# Patient Record
Sex: Female | Born: 1955 | Race: White | Hispanic: No | Marital: Married | Smoking: Never smoker
Health system: Southern US, Community
[De-identification: ages and names within clinical notes are randomized; demographics above are authoritative.]

## PROBLEM LIST (undated history)

## (undated) DIAGNOSIS — S6730XA Crushing injury of unspecified wrist, initial encounter: Secondary | ICD-10-CM

## (undated) DIAGNOSIS — I10 Essential (primary) hypertension: Secondary | ICD-10-CM

## (undated) DIAGNOSIS — B009 Herpesviral infection, unspecified: Secondary | ICD-10-CM

## (undated) HISTORY — DX: Crushing injury of unspecified wrist, initial encounter: S67.30XA

## (undated) HISTORY — PX: REDUCTION MAMMAPLASTY: SUR839

## (undated) HISTORY — DX: Essential (primary) hypertension: I10

## (undated) HISTORY — PX: TYMPANOPLASTY: SHX33

## (undated) HISTORY — DX: Herpesviral infection, unspecified: B00.9

---

## 1981-11-30 HISTORY — PX: BREAST ENHANCEMENT SURGERY: SHX7

## 1998-10-09 ENCOUNTER — Other Ambulatory Visit: Admission: RE | Admit: 1998-10-09 | Discharge: 1998-10-09 | Payer: Self-pay | Admitting: *Deleted

## 1999-09-23 ENCOUNTER — Ambulatory Visit (HOSPITAL_COMMUNITY): Admission: RE | Admit: 1999-09-23 | Discharge: 1999-09-23 | Payer: Self-pay | Admitting: *Deleted

## 1999-09-23 ENCOUNTER — Encounter: Payer: Self-pay | Admitting: *Deleted

## 1999-10-01 ENCOUNTER — Other Ambulatory Visit: Admission: RE | Admit: 1999-10-01 | Discharge: 1999-10-01 | Payer: Self-pay | Admitting: *Deleted

## 2000-10-27 ENCOUNTER — Other Ambulatory Visit: Admission: RE | Admit: 2000-10-27 | Discharge: 2000-10-27 | Payer: Self-pay | Admitting: *Deleted

## 2001-01-25 ENCOUNTER — Ambulatory Visit (HOSPITAL_COMMUNITY): Admission: RE | Admit: 2001-01-25 | Discharge: 2001-01-25 | Payer: Self-pay | Admitting: *Deleted

## 2001-01-25 ENCOUNTER — Encounter: Payer: Self-pay | Admitting: *Deleted

## 2001-02-01 ENCOUNTER — Encounter: Admission: RE | Admit: 2001-02-01 | Discharge: 2001-02-01 | Payer: Self-pay | Admitting: *Deleted

## 2001-02-01 ENCOUNTER — Encounter: Payer: Self-pay | Admitting: *Deleted

## 2001-11-07 ENCOUNTER — Other Ambulatory Visit: Admission: RE | Admit: 2001-11-07 | Discharge: 2001-11-07 | Payer: Self-pay | Admitting: *Deleted

## 2002-09-06 ENCOUNTER — Ambulatory Visit (HOSPITAL_COMMUNITY): Admission: RE | Admit: 2002-09-06 | Discharge: 2002-09-06 | Payer: Self-pay | Admitting: *Deleted

## 2002-09-06 ENCOUNTER — Encounter: Payer: Self-pay | Admitting: *Deleted

## 2002-11-09 ENCOUNTER — Other Ambulatory Visit: Admission: RE | Admit: 2002-11-09 | Discharge: 2002-11-09 | Payer: Self-pay | Admitting: *Deleted

## 2003-01-08 ENCOUNTER — Ambulatory Visit (HOSPITAL_COMMUNITY): Admission: RE | Admit: 2003-01-08 | Discharge: 2003-01-08 | Payer: Self-pay | Admitting: Internal Medicine

## 2003-10-01 HISTORY — PX: ABLATION: SHX5711

## 2003-10-23 ENCOUNTER — Ambulatory Visit (HOSPITAL_BASED_OUTPATIENT_CLINIC_OR_DEPARTMENT_OTHER): Admission: RE | Admit: 2003-10-23 | Discharge: 2003-10-23 | Payer: Self-pay | Admitting: Obstetrics and Gynecology

## 2003-11-15 ENCOUNTER — Other Ambulatory Visit: Admission: RE | Admit: 2003-11-15 | Discharge: 2003-11-15 | Payer: Self-pay | Admitting: *Deleted

## 2003-11-28 ENCOUNTER — Encounter: Admission: RE | Admit: 2003-11-28 | Discharge: 2003-11-28 | Payer: Self-pay | Admitting: *Deleted

## 2004-04-30 HISTORY — PX: BREAST CYST ASPIRATION: SHX578

## 2004-05-05 ENCOUNTER — Encounter: Admission: RE | Admit: 2004-05-05 | Discharge: 2004-05-05 | Payer: Self-pay | Admitting: Obstetrics and Gynecology

## 2004-08-30 DIAGNOSIS — S6730XA Crushing injury of unspecified wrist, initial encounter: Secondary | ICD-10-CM

## 2004-08-30 HISTORY — DX: Crushing injury of unspecified wrist, initial encounter: S67.30XA

## 2004-08-31 ENCOUNTER — Emergency Department (HOSPITAL_COMMUNITY): Admission: EM | Admit: 2004-08-31 | Discharge: 2004-08-31 | Payer: Self-pay | Admitting: Emergency Medicine

## 2004-12-25 ENCOUNTER — Ambulatory Visit (HOSPITAL_COMMUNITY): Admission: RE | Admit: 2004-12-25 | Discharge: 2004-12-25 | Payer: Self-pay | Admitting: Internal Medicine

## 2005-01-06 ENCOUNTER — Other Ambulatory Visit: Admission: RE | Admit: 2005-01-06 | Discharge: 2005-01-06 | Payer: Self-pay | Admitting: *Deleted

## 2005-02-10 ENCOUNTER — Encounter: Admission: RE | Admit: 2005-02-10 | Discharge: 2005-02-10 | Payer: Self-pay | Admitting: *Deleted

## 2005-10-05 ENCOUNTER — Encounter: Admission: RE | Admit: 2005-10-05 | Discharge: 2005-10-05 | Payer: Self-pay | Admitting: *Deleted

## 2006-01-26 ENCOUNTER — Other Ambulatory Visit: Admission: RE | Admit: 2006-01-26 | Discharge: 2006-01-26 | Payer: Self-pay | Admitting: Obstetrics & Gynecology

## 2006-05-18 ENCOUNTER — Encounter: Admission: RE | Admit: 2006-05-18 | Discharge: 2006-05-18 | Payer: Self-pay | Admitting: Obstetrics & Gynecology

## 2006-09-30 HISTORY — PX: BREAST IMPLANT REMOVAL: SUR1101

## 2007-03-11 ENCOUNTER — Other Ambulatory Visit: Admission: RE | Admit: 2007-03-11 | Discharge: 2007-03-11 | Payer: Self-pay | Admitting: Obstetrics & Gynecology

## 2007-09-29 ENCOUNTER — Encounter: Admission: RE | Admit: 2007-09-29 | Discharge: 2007-09-29 | Payer: Self-pay | Admitting: Family Medicine

## 2008-06-06 ENCOUNTER — Other Ambulatory Visit: Admission: RE | Admit: 2008-06-06 | Discharge: 2008-06-06 | Payer: Self-pay | Admitting: Obstetrics & Gynecology

## 2008-10-03 ENCOUNTER — Encounter: Admission: RE | Admit: 2008-10-03 | Discharge: 2008-10-03 | Payer: Self-pay | Admitting: Obstetrics & Gynecology

## 2009-10-07 ENCOUNTER — Encounter: Admission: RE | Admit: 2009-10-07 | Discharge: 2009-10-07 | Payer: Self-pay | Admitting: Obstetrics & Gynecology

## 2010-10-13 ENCOUNTER — Encounter: Admission: RE | Admit: 2010-10-13 | Discharge: 2010-10-13 | Payer: Self-pay | Admitting: Obstetrics & Gynecology

## 2010-10-21 DIAGNOSIS — Z9884 Bariatric surgery status: Secondary | ICD-10-CM | POA: Insufficient documentation

## 2010-10-24 ENCOUNTER — Encounter: Admission: RE | Admit: 2010-10-24 | Discharge: 2010-10-24 | Payer: Self-pay | Admitting: Obstetrics & Gynecology

## 2010-12-21 ENCOUNTER — Encounter: Payer: Self-pay | Admitting: Obstetrics & Gynecology

## 2011-04-17 NOTE — Op Note (Signed)
NAME:  Katherine Young, Katherine Young                            ACCOUNT NO.:  [id number]   MEDICAL RECORD NO.:  [id number]                   PATIENT TYPE:  AMB   LOCATION:  DAY                                  FACILITY:  [facility]   PHYSICIAN:  R. Roetta Sessions, M.D.              DATE OF BIRTH:  Aug 16, 1956   DATE OF PROCEDURE:  01/08/2003  DATE OF DISCHARGE:                                 OPERATIVE REPORT   PROCEDURE:  Esophagogastroduodenoscopy with biopsy.   INDICATION FOR PROCEDURE:  The patient is a 55 year old lady with  longstanding chronic reflux symptoms.  She comes for EGD.  We saw this lady  originally back in June 2003; however, because of her mother's illness she  put off evaluation until now.  She has been taking Prevacid 30 mg orally  daily to twice daily to combat symptoms, and she has breakthrough symptoms a  couple of times a week.  She is at least 30 pounds over her ideal body  weight.  Please see my 05/23/02 consultation note and my 01/08/03 updated H&P  for more information.   MONITORING:  O2 saturation, blood pressure, pulse, and respirations were  monitored throughout the entirety of the procedure.   ANESTHESIA:  Conscious sedation with Versed 3 mg IV, Demerol 50 mg IV.   INSTRUMENT:  Instrument:  Olympus video chip gastroscope.   DESCRIPTION OF PROCEDURE:   FINDINGS:  Examination of the tubular esophagus revealed no mucosal  abnormalities or evidence of Barrett's esophagus, etc.  The EG junction was  easily traversed.   Stomach:  The gastric cavity was empty and insufflated well with air, and a  thorough examination of the gastric mucosa, including a retroflexed view of  the proximal stomach and esophagogastric junction, demonstrated submucosal  petechial hemorrhages involving the fundus and body.  There were no erosions  or evidence of ulcer disease or other abnormality.  Please see photos.  The  pylorus was patent and easily traversed.   Duodenum:  The bulb and second  portion appeared normal.   Therapeutic/diagnostic maneuvers performed:  Biopsies of the fundus and body  were taken for histologic study.  The patient tolerated the procedure well  and was reactive in endoscopy.   IMPRESSION:  1. Normal esophagus.  2. Submucosal petechiae diffusely involving the fundus and body, of     uncertain significance.  The remainder of gastric mucosa and duodenum     through the second portion appeared normal, status post biopsies of the     fundus and body.   RECOMMENDATIONS:  1. Antireflux measures and weight loss.  2. Continue Prevacid 30 mg orally daily to twice daily.  Antireflux     literature given to the patient.  3. Check H. Pylori serologies, follow up on pathology.  4. Further recommendations to follow.  Jonathon Bellows, M.D.    RMR/MEDQ  D:  01/08/2003  T:  01/08/2003  Job:  [id number]   cc:   Kingsley Callander. Ouida Sills, M.D.  [address]  [place]  [place] [address]  Fax: [phone]

## 2011-04-17 NOTE — Op Note (Signed)
NAME:  KOREN, PLYLER                            ACCOUNT NO.:  [id number]   MEDICAL RECORD NO.:  [id number]                   PATIENT TYPE:  AMB   LOCATION:  [facility]                                 FACILITY:  [facility]   PHYSICIAN:  Laqueta Linden, M.D.                 DATE OF BIRTH:  03/15/1956   DATE OF PROCEDURE:  10/23/2003  DATE OF DISCHARGE:                                 OPERATIVE REPORT   PREOPERATIVE DIAGNOSIS:  Menorrhagia, age related with small endometrial  polyp.   POSTOPERATIVE DIAGNOSIS:  Menorrhagia, age related with small endometrial  polyp.   PROCEDURE:  Hydrothermal ablation.   SURGEON:  Laqueta Linden, M.D.   ANESTHESIA:  General LMA.   ESTIMATED BLOOD LOSS:  Less than 5 mL.   SPECIMENS:  None.   SORBITOL NET INTAKE:  Zero.   COMPLICATIONS:  None.   INDICATIONS FOR PROCEDURE:  Katherine Young is a 55 year old perimenopausal  female with worsening menorrhagia and associated anemia with a hemoglobin as  low as 10. She was tried on combination birth control pills but had  elevation in her blood pressure. She therefore was switched to a  progesterone only pill with marginal response. She subsequently underwent  evaluation with pelvic ultrasound and sonohysterogram revealing a normal  size uterus and what appeared to be a small polyp in the posterior lower  uterine segment. Endometrial sampling revealed benign interval phase  endometrium. Alternatives including hysteroscopic resection of the polyp  versus HTA were offered to the patient. She elected the latter option as it  might prove more definite in terms of amenorrhea. She has seen the informed  consent film regarding operative hysteroscopy and has been informed of  risks, benefits, complications and limitations of the procedure including  anesthesia risk, infection, bleeding, uterine perforation, possible burn to  intraabdominal or cervical vaginal tissues as well as a lack of relief of  symptoms or temporary or  incomplete relief of symptoms from the procedure.  She received one injection of Depot Lupron one month prior to the surgery to  aid in endometrium perforation. She presents now for HTA. She has seen the  informed consent film, full consent was given. She received 30 mg of Toradol  IV on call to the OR.   DESCRIPTION OF PROCEDURE:  The patient was taken to the OR and after proper  identification and consents were ascertained, she was placed on the  operating table in the supine position. She had placement of a general LMA  anesthesia and then was placed in the Lorimor stirrups. The perineum and  vagina were prepped and draped in a routine sterile fashion. The bladder was  emptied with a red rubber catheter. The uterus was palpated to be anterior  normal size and mobile. The patient received an additional 30 mg of Toradol  IM as the procedure was getting under way. The internal os  sounded to 7.5  cm. The internal os was gently dilated to #21 [facility] dilator. The  hysteroscope with appropriate encasements for the HTA procedure was then  inserted into the uterine cavity. The uterine cavity was felt to be  diffusely atrophic. No polyp was actually seen. The tubal ostia were  visualized. At this point, an HTA procedure was performed in a routine  fashion using close monitoring of fluid levels. A 10 minute ablation was  performed after which the fluid was cooled and evacuated. There was no  leakage of fluid noted either on the controls,  on the monitors or by direct  visualization of the cervix throughout the procedure. All instruments were  removed, the tenaculum site was hemostatic. The patient was stable on  transfer to the PACU. She will be observed and discharged per anesthesia  protocol. She will be given routine discharge instructions and told to  followup in the office in 2-3 weeks time. She is to take Advil or Aleve as  needed for cramping and continue routine medications. She was given a   prescription for Percocet, dispensed 15, 1-2  q. 3-4h p.r.n. pain with no  refills for excessive cramping.                                               Laqueta Linden, M.D.    LKS/MEDQ  D:  10/23/2003  T:  10/23/2003  Job:  [id number]

## 2011-09-17 ENCOUNTER — Other Ambulatory Visit: Payer: Self-pay | Admitting: Obstetrics & Gynecology

## 2011-09-17 DIAGNOSIS — Z1231 Encounter for screening mammogram for malignant neoplasm of breast: Secondary | ICD-10-CM

## 2011-10-01 HISTORY — PX: LAPAROSCOPIC GASTRIC BANDING: SHX1100

## 2011-10-28 ENCOUNTER — Ambulatory Visit
Admission: RE | Admit: 2011-10-28 | Discharge: 2011-10-28 | Disposition: A | Discharge status: Home / Self Care | Payer: BC Managed Care – PPO | Source: Ambulatory Visit | Attending: Obstetrics & Gynecology | Admitting: Obstetrics & Gynecology

## 2011-10-28 DIAGNOSIS — Z1231 Encounter for screening mammogram for malignant neoplasm of breast: Secondary | ICD-10-CM

## 2012-07-31 HISTORY — PX: ABDOMINOPLASTY: SUR9

## 2012-09-01 ENCOUNTER — Other Ambulatory Visit: Payer: Self-pay | Admitting: Obstetrics & Gynecology

## 2012-09-01 DIAGNOSIS — Z1231 Encounter for screening mammogram for malignant neoplasm of breast: Secondary | ICD-10-CM

## 2012-10-31 ENCOUNTER — Ambulatory Visit: Payer: BC Managed Care – PPO

## 2012-11-22 ENCOUNTER — Ambulatory Visit
Admission: RE | Admit: 2012-11-22 | Discharge: 2012-11-22 | Disposition: A | Discharge status: Home / Self Care | Payer: BC Managed Care – PPO | Source: Ambulatory Visit | Attending: Obstetrics & Gynecology | Admitting: Obstetrics & Gynecology

## 2012-11-22 DIAGNOSIS — Z1231 Encounter for screening mammogram for malignant neoplasm of breast: Secondary | ICD-10-CM

## 2013-05-04 DIAGNOSIS — K219 Gastro-esophageal reflux disease without esophagitis: Secondary | ICD-10-CM | POA: Insufficient documentation

## 2013-09-25 ENCOUNTER — Encounter: Payer: Self-pay | Admitting: Gynecology

## 2013-09-27 ENCOUNTER — Encounter: Payer: Self-pay | Admitting: Gynecology

## 2013-09-27 ENCOUNTER — Ambulatory Visit (INDEPENDENT_AMBULATORY_CARE_PROVIDER_SITE_OTHER): Payer: BC Managed Care – PPO | Admitting: Gynecology

## 2013-09-27 VITALS — BP 128/80 | HR 78 | Resp 18 | Ht 63.0 in | Wt 196.0 lb

## 2013-09-27 DIAGNOSIS — Z01419 Encounter for gynecological examination (general) (routine) without abnormal findings: Secondary | ICD-10-CM

## 2013-09-27 DIAGNOSIS — N951 Menopausal and female climacteric states: Secondary | ICD-10-CM

## 2013-09-27 DIAGNOSIS — R635 Abnormal weight gain: Secondary | ICD-10-CM

## 2013-09-27 DIAGNOSIS — Z0000 Encounter for general adult medical examination without abnormal findings: Secondary | ICD-10-CM

## 2013-09-27 LAB — POCT URINALYSIS DIPSTICK
Urobilinogen, UA: NEGATIVE
pH, UA: 5

## 2013-09-27 MED ORDER — ESTRADIOL-NORETHINDRONE ACET 0.5-0.1 MG PO TABS: 1.0000 | ORAL_TABLET | Freq: Every day | ORAL | Status: DC

## 2013-09-27 NOTE — Patient Instructions (Signed)

## 2013-09-27 NOTE — Progress Notes (Signed)
57 y.o. Marriede Caucasian female   No obstetric history on file. here for annual exam. Pt reports menses are absent. She does not report hot flashes, does have night sweats, does not have vaginal dryness.  She is not using lubricants.  She does not report post-menopasual bleeding.  Pt was taken off synthroid 41m ago, and now reports feeling fatigue, sluggish and has noticed nail changes.   Pt is happy on activella and would like to continue.  Gained 16# since last year.  No LMP recorded.          Sexually active: yes  The current method of family planning is Ablation.    Exercising: yes  walking 3x/wk, light weight lifting 2x/wk Last pap: 09/23/12 NEG HR HPV Abnormal PAP: no Mammogram: 11/24/2012 Bi-Rads 1  BSE: yes Colonoscopy: 12/05/2008 DEXA:  Never had one Alcohol: maybe 1-2 drinks  Tobacco: no  Hgb: Work ; Urine: Industrial/product designer , Trace Protein  Health Maintenance  Topic Date Due  . Pap Smear  06/30/1976  . Colonoscopy  06/30/2008  . Influenza Vaccine  06/30/2013  . Mammogram  11/22/2014  . Tetanus/tdap  08/02/2019    Family History  Problem Relation Age of Onset  . Breast cancer Mother     There are no active problems to display for this patient.   Past Medical History  Diagnosis Date  . Hypertension   . Crushed injury, wrist 08/2004    Right    Past Surgical History  Procedure Laterality Date  . Breast cyst aspiration Right 04/2004    @ [facility]  . Breast enhancement surgery  1983  . Tympanoplasty  12/89; 11/90  . Ablation  10/2003    HTA  . Breast implant removal  09/2006  . Laparoscopic gastric banding  11/12  . Abdominoplasty  9/13    Allergies: Review of patient's allergies indicates no known allergies.  Current Outpatient Prescriptions  Medication Sig Dispense Refill  . amphetamine-dextroamphetamine (ADDERALL XR) 10 MG 24 hr capsule daily.      . Cholecalciferol (VITAMIN D PO) Take by mouth.      . citalopram (CELEXA) 20 MG tablet Take 20 mg by mouth  daily.      . DRYSOL 20 % external solution       . Estradiol-Norethindrone Acet (ACTIVELLA) 0.5-0.1 MG per tablet Take 1 tablet by mouth daily.      Marland Kitchen LOTEMAX 0.5 % GEL daily.      . Multiple Vitamins-Minerals (MULTIVITAMIN PO) Take by mouth daily.      Marland Kitchen olmesartan-hydrochlorothiazide (BENICAR HCT) 20-12.5 MG per tablet Take 1 tablet by mouth daily.      . RESTASIS 0.05 % ophthalmic emulsion daily.      . Vitamin D, Ergocalciferol, (DRISDOL) 50000 UNITS CAPS capsule once a week.      . levothyroxine (SYNTHROID) 50 MCG tablet Take 50 mcg by mouth daily before breakfast.      . Testosterone Propionate 2 % OINT Place onto the skin.       No current facility-administered medications for this visit.    ROS: Pertinent items are noted in HPI.  Exam:    There were no vitals taken for this visit. Weight change: @WEIGHTCHANGE @ Last 3 height recordings:  Ht Readings from Last 3 Encounters:  No data found for Ht   General appearance: alert, cooperative and appears stated age Head: Normocephalic, without obvious abnormality, atraumatic Neck: no adenopathy, no carotid bruit, no JVD, supple, symmetrical, trachea midline and thyroid not  enlarged, symmetric, no tenderness/mass/nodules Lungs: clear to auscultation bilaterally Breasts: normal appearance, no masses or tenderness, s/p reduction Heart: regular rate and rhythm, S1, S2 normal, no murmur, click, rub or gallop Abdomen: soft, non-tender; bowel sounds normal; no masses,  no organomegaly Extremities: extremities normal, atraumatic, no cyanosis or edema Skin: Skin color, texture, turgor normal. No rashes or lesions Lymph nodes: Cervical, supraclavicular, and axillary nodes normal. no inguinal nodes palpated Neurologic: Grossly normal   Pelvic: External genitalia:  no lesions              Urethra: normal appearing urethra with no masses, tenderness or lesions              Bartholins and Skenes: normal                 Vagina: normal  appearing vagina with normal color and discharge, no lesions              Cervix: normal appearance              Pap taken: no        Bimanual Exam:  Uterus:  uterus is normal size, shape, consistency and nontender                                      Adnexa:    normal adnexa in size, nontender and no masses                                      Rectovaginal: Confirms                                      Anus:  normal sphincter tone, no lesions  A: well woman no contraindication to continue hormonal therapy      P: mammogram annual Refill acitvella Discuss synthroid with PCP Will increase exercise and return to weight watchers or nutrition  counseled on breast self exam, mammography screening, use and side effects of HRT, menopause, adequate intake of calcium and vitamin D, diet and exercise return annually or prn Discussed PAP guideline changes, importance of weight bearing exercises, calcium, vit D and balanced diet.  An After Visit Summary was printed and given to the patient.

## 2013-10-24 ENCOUNTER — Other Ambulatory Visit: Payer: Self-pay

## 2013-10-24 DIAGNOSIS — Z9882 Breast implant status: Secondary | ICD-10-CM

## 2013-10-24 DIAGNOSIS — Z1231 Encounter for screening mammogram for malignant neoplasm of breast: Secondary | ICD-10-CM

## 2013-11-09 ENCOUNTER — Other Ambulatory Visit: Payer: Self-pay | Admitting: Gynecology

## 2013-12-12 ENCOUNTER — Ambulatory Visit
Admission: RE | Admit: 2013-12-12 | Discharge: 2013-12-12 | Disposition: A | Discharge status: Home / Self Care | Payer: BC Managed Care – PPO | Source: Ambulatory Visit

## 2013-12-12 DIAGNOSIS — Z9882 Breast implant status: Secondary | ICD-10-CM

## 2013-12-12 DIAGNOSIS — Z1231 Encounter for screening mammogram for malignant neoplasm of breast: Secondary | ICD-10-CM

## 2014-09-26 ENCOUNTER — Telehealth: Payer: Self-pay | Admitting: Gynecology

## 2014-09-26 NOTE — Telephone Encounter (Signed)
Call to pt let her know that dr lathrop is no longer with the practice and we need to rs her app lmtcb to rs °

## 2014-10-01 ENCOUNTER — Other Ambulatory Visit: Payer: Self-pay | Admitting: Gynecology

## 2014-10-01 NOTE — Telephone Encounter (Signed)
Last refilled/Last AEX 09/27/13 #28/11 rfs  Last Mammogram: 12/13/13 Bi-Rads 1: Negative  No current AEX scheduled   Left Message To Call Back to schedule AEX

## 2014-10-02 NOTE — Telephone Encounter (Signed)
S/w patient she is needing refill of her Katherine Hartiganactivella is going out of town from tomorrow through Sunday, patient is scheduled for AEX 11/01/14 with Ms. Debbie.  [facility] patient is aware.

## 2014-10-12 ENCOUNTER — Ambulatory Visit: Payer: BC Managed Care – PPO | Admitting: Gynecology

## 2014-10-17 ENCOUNTER — Ambulatory Visit: Payer: BC Managed Care – PPO | Admitting: Gynecology

## 2014-10-29 ENCOUNTER — Other Ambulatory Visit: Payer: Self-pay | Admitting: Certified Nurse Midwife

## 2014-10-29 NOTE — Telephone Encounter (Signed)
Last refilled: 10/02/14 #28/0 rfs by Ms. Debbie AEX scheduled for 11/01/14 with Ms. Debbie Last Mammogam: 12/13/13 Bi-Rads 1: Negative  S/w patient she does need a refill has only 2 pills left and will need it before her AEX Thursday.  Estradiol-Norethindrone #28/0 rfs sent in to last patient until AEX

## 2014-11-01 ENCOUNTER — Encounter: Payer: Self-pay | Admitting: Certified Nurse Midwife

## 2014-11-01 ENCOUNTER — Ambulatory Visit (INDEPENDENT_AMBULATORY_CARE_PROVIDER_SITE_OTHER): Payer: BC Managed Care – PPO | Admitting: Certified Nurse Midwife

## 2014-11-01 VITALS — BP 120/80 | HR 70 | Resp 16 | Ht 63.25 in | Wt 205.0 lb

## 2014-11-01 DIAGNOSIS — N951 Menopausal and female climacteric states: Secondary | ICD-10-CM

## 2014-11-01 DIAGNOSIS — Z0000 Encounter for general adult medical examination without abnormal findings: Secondary | ICD-10-CM

## 2014-11-01 DIAGNOSIS — Z124 Encounter for screening for malignant neoplasm of cervix: Secondary | ICD-10-CM

## 2014-11-01 DIAGNOSIS — Z01419 Encounter for gynecological examination (general) (routine) without abnormal findings: Secondary | ICD-10-CM

## 2014-11-01 DIAGNOSIS — B372 Candidiasis of skin and nail: Secondary | ICD-10-CM

## 2014-11-01 LAB — POCT URINALYSIS DIPSTICK
Bilirubin, UA: NEGATIVE
Blood, UA: NEGATIVE
Glucose, UA: NEGATIVE
Ketones, UA: NEGATIVE
Leukocytes, UA: NEGATIVE
Nitrite, UA: NEGATIVE
Protein, UA: NEGATIVE
Urobilinogen, UA: NEGATIVE
pH, UA: 5

## 2014-11-01 MED ORDER — NYSTATIN-TRIAMCINOLONE 100000-0.1 UNIT/GM-% EX CREA: 1.0000 "application " | TOPICAL_CREAM | Freq: Two times a day (BID) | CUTANEOUS | Status: DC

## 2014-11-01 MED ORDER — ESTRADIOL-NORETHINDRONE ACET 0.5-0.1 MG PO TABS: 1.0000 | ORAL_TABLET | Freq: Every day | ORAL | Status: DC

## 2014-11-01 NOTE — Progress Notes (Signed)
58 y.o. 122P2004 Married Caucasian Fe here for annual exam.  Menopausal on HRT. Denies vaginal bleeding or vaginal dryness. No hot flashes or night sweats now. Sees PCP every 6 months for hypertension management., has remained stable. No health issues today.  No LMP recorded. Patient has had an ablation.          Sexually active: Yes.    The current method of family planning is none.   ablation Exercising: Yes.    walk & run Smoker:  no  Health Maintenance: Pap:  09-23-12 neg HPV HR neg MMG:  12-12-13 category b density, birads category 1:neg Colonoscopy: 12-05-2008  Normal 10 years BMD:   none TDaP:  2010 Labs: Poct urine-neg Self breast exam: done monthly   reports that she has never smoked. She does not have any smokeless tobacco history on file. She reports that she drinks about 0.6 - 1.2 oz of alcohol per week. She reports that she does not use illicit drugs.  Past Medical History  Diagnosis Date  . Hypertension   . Crushed injury, wrist 08/2004    Right    Past Surgical History  Procedure Laterality Date  . Breast cyst aspiration Right 04/2004    @ [facility]  . Breast enhancement surgery  1983  . Tympanoplasty  12/89; 11/90  . Ablation  10/2003    HTA  . Breast implant removal  09/2006  . Laparoscopic gastric banding  11/12  . Abdominoplasty  9/13    Current Outpatient Prescriptions  Medication Sig Dispense Refill  . amphetamine-dextroamphetamine (ADDERALL XR) 10 MG 24 hr capsule daily.    . Cholecalciferol (VITAMIN D PO) Take by mouth.    . citalopram (CELEXA) 20 MG tablet Take 20 mg by mouth daily.    . Estradiol-Norethindrone Acet 0.5-0.1 MG per tablet TAKE ONE (1) TABLET EACH DAY 28 tablet 0  . lubiprostone (AMITIZA) 24 MCG capsule     . Multiple Vitamins-Minerals (MULTIVITAMIN PO) Take by mouth daily.    Marland Kitchen. olmesartan-hydrochlorothiazide (BENICAR HCT) 20-12.5 MG per tablet Take 1 tablet by mouth daily.    . RESTASIS 0.05 % ophthalmic emulsion daily.     No current  facility-administered medications for this visit.    Family History  Problem Relation Age of Onset  . Breast cancer Mother   . Hypertension Mother   . Heart disease Mother   . Hypertension Father   . Heart disease Father   . Heart attack Father     ROS:  Pertinent items are noted in HPI.  Otherwise, a comprehensive ROS was negative.  Exam:   BP 120/80 mmHg  Pulse 70  Resp 16  Ht 5' 3.25" (1.607 m)  Wt 205 lb (92.987 kg)  BMI 36.01 kg/m2 Height: 5' 3.25" (160.7 cm)  Ht Readings from Last 3 Encounters:  11/01/14 5' 3.25" (1.607 m)  09/27/13 5\' 3"  (1.6 m)    General appearance: alert, cooperative and appears stated age Head: Normocephalic, without obvious abnormality, atraumatic Neck: no adenopathy, supple, symmetrical, trachea midline and thyroid normal to inspection and palpation Lungs: clear to auscultation bilaterally Breasts: normal appearance, no masses or tenderness, No nipple retraction or dimpling, No nipple discharge or bleeding, No axillary or supraclavicular adenopathy Small pink scaling skin area with exudate under breast does not involve breast wet prep taken Heart: regular rate and rhythm Abdomen: soft, non-tender; no masses,  no organomegaly Extremities: extremities normal, atraumatic, no cyanosis or edema Skin: Skin color, texture, turgor normal. No rashes or lesions  Lymph nodes: Cervical, supraclavicular, and axillary nodes normal. No abnormal inguinal nodes palpated Neurologic: Grossly normal   Pelvic: External genitalia:  no lesions              Urethra:  normal appearing urethra with no masses, tenderness or lesions              Bartholin's and Skene's: normal                 Vagina: normal appearing vagina with normal color and discharge, no lesions              Cervix: normal, non tender, no lesions              Pap taken: Yes.   Bimanual Exam:  Uterus:  normal size, contour, position, consistency, mobility, non-tender and mid position               Adnexa: normal adnexa and no mass, fullness, tenderness               Rectovaginal: Confirms               Anus:  normal sphincter tone, no lesions  A:  Well Woman with normal exam  Menopausal on HRT desires continuance  Yeast dermatitis  Hypertension with PCP management, stable  Family history of breast cancer mother 1750's  P:   Reviewed health and wellness pertinent to exam  Discussed risks and benefits of HRT. Patient and provider feel a good choice to continue. Patient will advise if any health status change.  Reviewed findings and etiology. Encouraged to change bra when perspiring heavily to avoid reoccurrence. Increase yogurt in diet. Be sure to wash area and dry well.  Rx Mycolog see order  Continue follow up with MD as indicated.  Stress importance of SBE and yearly 3 d mammogram  Pap smear taken today with HPV reflex   counseled on breast self exam, mammography screening, use and side effects of HRT, adequate intake of calcium and vitamin D, diet and exercise return annually or prn  An After Visit Summary was printed and given to the patient.

## 2014-11-01 NOTE — Patient Instructions (Signed)
Yeast Infection of the Skin Some yeast on the skin is normal, but sometimes it causes an infection. If you have a yeast infection, it shows up as white or light brown patches on brown skin. You can see it better in the summer on tan skin. It causes light-colored holes in your suntan. It can happen on any area of the body. This cannot be passed from person to person. HOME CARE  Scrub your skin daily with a dandruff shampoo. Your rash may take a couple weeks to get well.  Do not scratch or itch the rash. GET HELP RIGHT AWAY IF:   You get another infection from scratching. The skin may get warm, red, and may ooze fluid.  The infection does not seem to be getting better. MAKE SURE YOU:  Understand these instructions.  Will watch your condition.  Will get help right away if you are not doing well or get worse. Document Released: 10/29/2008 Document Revised: 02/08/2012 Document Reviewed: 10/29/2008 [facility] Patient Information 2015 [employer] [place]. This information is not intended to replace advice given to you by your health care provider. Make sure you discuss any questions you have with your health care provider.  EXERCISE AND DIET:  We recommended that you start or continue a regular exercise program for good health. Regular exercise means any activity that makes your heart beat faster and makes you sweat.  We recommend exercising at least 30 minutes per day at least 3 days a week, preferably 4 or 5.  We also recommend a diet low in fat and sugar.  Inactivity, poor dietary choices and obesity can cause diabetes, heart attack, stroke, and kidney damage, among others.    ALCOHOL AND SMOKING:  Women should limit their alcohol intake to no more than 7 drinks/beers/glasses of wine (combined, not each!) per week. Moderation of alcohol intake to this level decreases your risk of breast cancer and liver damage. And of course, no recreational drugs are part of a healthy lifestyle.  And absolutely no  smoking or even second hand smoke. Most people know smoking can cause heart and lung diseases, but did you know it also contributes to weakening of your bones? Aging of your skin?  Yellowing of your teeth and nails?  CALCIUM AND VITAMIN D:  Adequate intake of calcium and Vitamin D are recommended.  The recommendations for exact amounts of these supplements seem to change often, but generally speaking 600 mg of calcium (either carbonate or citrate) and 800 units of Vitamin D per day seems prudent. Certain women may benefit from higher intake of Vitamin D.  If you are among these women, your doctor will have told you during your visit.    PAP SMEARS:  Pap smears, to check for cervical cancer or precancers,  have traditionally been done yearly, although recent scientific advances have shown that most women can have pap smears less often.  However, every woman still should have a physical exam from her gynecologist every year. It will include a breast check, inspection of the vulva and vagina to check for abnormal growths or skin changes, a visual exam of the cervix, and then an exam to evaluate the size and shape of the uterus and ovaries.  And after 56 years of age, a rectal exam is indicated to check for rectal cancers. We will also provide age appropriate advice regarding health maintenance, like when you should have certain vaccines, screening for sexually transmitted diseases, bone density testing, colonoscopy, mammograms, etc.   MAMMOGRAMS:  All women over 598 years old should have a yearly mammogram. Many facilities now offer a "3D" mammogram, which may cost around $50 extra out of pocket. If possible,  we recommend you accept the option to have the 3D mammogram performed.  It both reduces the number of women who will be called back for extra views which then turn out to be normal, and it is better than the routine mammogram at detecting truly abnormal areas.    COLONOSCOPY:  Colonoscopy to screen for  colon cancer is recommended for all women at age 56.  We know, you hate the idea of the prep.  We agree, BUT, having colon cancer and not knowing it is worse!!  Colon cancer so often starts as a polyp that can be seen and removed at colonscopy, which can quite literally save your life!  And if your first colonoscopy is normal and you have no family history of colon cancer, most women don't have to have it again for 10 years.  Once every ten years, you can do something that may end up saving your life, right?  We will be happy to help you get it scheduled when you are ready.  Be sure to check your insurance coverage so you understand how much it will cost.  It may be covered as a preventative service at no cost, but you should check your particular policy.

## 2014-11-06 LAB — IPS PAP TEST WITH REFLEX TO HPV

## 2014-11-07 NOTE — Progress Notes (Signed)
Reviewed personally.  M. Suzanne Elta Angell, MD.  

## 2015-01-01 ENCOUNTER — Other Ambulatory Visit: Payer: Self-pay | Admitting: Certified Nurse Midwife

## 2015-01-01 ENCOUNTER — Other Ambulatory Visit: Payer: Self-pay

## 2015-01-01 DIAGNOSIS — Z1231 Encounter for screening mammogram for malignant neoplasm of breast: Secondary | ICD-10-CM

## 2015-01-01 NOTE — Telephone Encounter (Signed)
Medication refill request: estradiol-Norethindrone Acet 0.5-0.1 Last AEX:  11/01/14 Next AEX: 11/05/15 Last MMG (if hormonal medication request): 12/12/13 BIRADS1:Neg Refill authorized: 11/01/14 #28/0R. Today ##28/11R?

## 2015-01-01 NOTE — Telephone Encounter (Signed)
Called pt she will schedule MMG and will call back when she gets it done

## 2015-01-01 NOTE — Telephone Encounter (Signed)
No refill until mammogram results in

## 2015-01-04 ENCOUNTER — Ambulatory Visit
Admission: RE | Admit: 2015-01-04 | Discharge: 2015-01-04 | Disposition: A | Discharge status: Home / Self Care | Payer: BC Managed Care – PPO | Source: Ambulatory Visit

## 2015-01-04 DIAGNOSIS — Z1231 Encounter for screening mammogram for malignant neoplasm of breast: Secondary | ICD-10-CM

## 2015-01-07 ENCOUNTER — Ambulatory Visit: Payer: BC Managed Care – PPO

## 2015-01-08 ENCOUNTER — Other Ambulatory Visit: Payer: Self-pay | Admitting: Certified Nurse Midwife

## 2015-01-08 NOTE — Telephone Encounter (Signed)
Patient aware that rx has been sent in

## 2015-01-08 NOTE — Telephone Encounter (Signed)
Medication refill request: Estradiol-Norethindrone Acet 0.5-0.1 mg  Last AEX:  11/01/14 with Ms. Debbie Next AEX: 11/05/15 with Ms. Debbie Last MMG (if hormonal medication request): 01/04/15 Breast Density Category B- Bi-Rads 1: Negative  Refill authorized: #28/11 rfs?, please advise.  According to refill encounter from 01/01/15 patient needing mammogram done before refills, mammogram done 01/04/15 okay to send in refills?

## 2015-11-05 ENCOUNTER — Ambulatory Visit: Payer: BC Managed Care – PPO | Admitting: Certified Nurse Midwife

## 2015-11-29 ENCOUNTER — Encounter: Payer: Self-pay | Admitting: Obstetrics & Gynecology

## 2015-11-29 ENCOUNTER — Ambulatory Visit (INDEPENDENT_AMBULATORY_CARE_PROVIDER_SITE_OTHER): Payer: BC Managed Care – PPO | Admitting: Obstetrics & Gynecology

## 2015-11-29 VITALS — BP 110/76 | HR 64 | Resp 16 | Ht 63.0 in | Wt 207.2 lb

## 2015-11-29 DIAGNOSIS — Z01419 Encounter for gynecological examination (general) (routine) without abnormal findings: Secondary | ICD-10-CM | POA: Diagnosis not present

## 2015-11-29 DIAGNOSIS — Z0000 Encounter for general adult medical examination without abnormal findings: Secondary | ICD-10-CM

## 2015-11-29 NOTE — Progress Notes (Signed)
Patient ID: Katherine Young, female   DOB: [birth year], 59 y.o.   MRN: [id number]   59 y.o. [id number] MarriedCaucasianF here for annual exam.  Doing well.  Denies vaginal bleeding.  Reports she's having some LLQ pain that feels sharp times.  Negative colonoscopy in 2010 with Dr. Loreta Ave.  Is on Amitiza due to constipation.  Takes once daily but was prescribed twice daily.  This made her too loose.  Having increased gas with the Amitiza.  Hadn't really thought about it but now that she's saying it, she feels the pain may be related to increased gas.  No LMP recorded. Patient has had an ablation.          Sexually active: Yes.    The current method of family planning is ablation.    Exercising: Yes.    works out in gym 3x/week. Smoker:  no  Health Maintenance: Pap:  11-01-14 Neg, 2013 Neg HR HPV History of abnormal Pap:  no MMG:  01-07-15 3D/density cat.B/Neg/BiRads1:[facility]. Colonoscopy:  12-05-2008 normal with Dr. Thelma Comp due 11/2018. BMD:   n/a TDaP:  2010 Screening Labs: PCP, Hb today: PCP, Urine today: PCP   reports that she has never smoked. She does not have any smokeless tobacco history on file. She reports that she drinks about 0.6 - 1.2 oz of alcohol per week. She reports that she does not use illicit drugs.  Past Medical History  Diagnosis Date  . Hypertension   . Crushed injury, wrist 08/2004    Right    Past Surgical History  Procedure Laterality Date  . Breast cyst aspiration Right 04/2004    @ [facility]  . Breast enhancement surgery  1983  . Tympanoplasty  12/89; 11/90  . Ablation  10/2003    HTA  . Breast implant removal  09/2006  . Laparoscopic gastric banding  11/12  . Abdominoplasty  9/13    Current Outpatient Prescriptions  Medication Sig Dispense Refill  . amphetamine-dextroamphetamine (ADDERALL XR) 10 MG 24 hr capsule daily.    . Cholecalciferol (VITAMIN D PO) Take by mouth.    . citalopram (CELEXA) 20 MG tablet Take 20 mg by mouth daily.    .  Estradiol-Norethindrone Acet 0.5-0.1 MG per tablet TAKE ONE (1) TABLET BY MOUTH EVERY DAY 28 tablet 12  . lubiprostone (AMITIZA) 24 MCG capsule     . Multiple Vitamins-Minerals (MULTIVITAMIN PO) Take by mouth daily.    Marland Kitchen olmesartan-hydrochlorothiazide (BENICAR HCT) 20-12.5 MG per tablet Take 1 tablet by mouth daily.    . RESTASIS 0.05 % ophthalmic emulsion daily.     No current facility-administered medications for this visit.    Family History  Problem Relation Age of Onset  . Breast cancer Mother 51    dec 73-had mets to liver from breast ca  . Hypertension Mother   . Heart disease Mother   . Hypertension Father   . Heart disease Father   . Heart attack Father     ROS:  Pertinent items are noted in HPI.  Otherwise, a comprehensive ROS was negative.  Exam:   BP 110/76 mmHg  Pulse 64  Resp 16  Ht  (1.6 m)  Wt 207 lb 3.2 oz (93.985 kg)  BMI 36.71 kg/m2  Weight change: +2# Height:  (160 cm)  Ht Readings from Last 3 Encounters:  11/29/15  (1.6 m)  11/01/14 5' 3.25" (1.607 m)  09/27/13  (1.6 m)    General appearance: alert, cooperative and  appears stated age Head: Normocephalic, without obvious abnormality, atraumatic Neck: no adenopathy, supple, symmetrical, trachea midline and thyroid normal to inspection and palpation Lungs: clear to auscultation bilaterally Breasts: normal appearance, no masses or tenderness Heart: regular rate and rhythm Abdomen: soft, non-tender; bowel sounds normal; no masses,  no organomegaly Extremities: extremities normal, atraumatic, no cyanosis or edema Skin: Skin color, texture, turgor normal. No rashes or lesions Lymph nodes: Cervical, supraclavicular, and axillary nodes normal. No abnormal inguinal nodes palpated Neurologic: Grossly normal   Pelvic: External genitalia:  no lesions              Urethra:  normal appearing urethra with no masses, tenderness or lesions              Bartholins and Skenes: normal                  Vagina: normal appearing vagina with normal color and discharge, no lesions              Cervix: no lesions              Pap taken: No. Bimanual Exam:  Uterus:  normal size, contour, position, consistency, mobility, non-tender              Adnexa: normal adnexa and no mass, fullness, tenderness               Rectovaginal: Confirms               Anus:  normal sphincter tone, no lesions  Chaperone was present for exam.   A: Well Woman with normal exam PMP, on HRT Hypertension Family hx of breast cancer diagnosed in her 50's  LLQ pain that I think is related to increased flatus.  Exam normal today.  P: Mammogram yearly Neg Pap 2015, neg pap with neg HR HPV 2013.  No pap today. Pt still desirous of continuing HRT.  Aware of risks of DVT/PE/stroke/MI/breast cancer.  Rx for estradiol/norethindrone 0.5/1.0mg  to pharmacy.  #30/13 RF.   Labs with PCP yearly.  No blood work obtained today. F/U 1 year or prn.

## 2015-12-03 MED ORDER — ESTRADIOL-NORETHINDRONE ACET 0.5-0.1 MG PO TABS
1.0000 | ORAL_TABLET | Freq: Every day | ORAL | Status: DC
Start: 2015-12-03 — End: 2015-12-09

## 2015-12-05 ENCOUNTER — Other Ambulatory Visit: Payer: Self-pay

## 2015-12-05 NOTE — Telephone Encounter (Signed)
Received Request from [facility] for Estradiol-Norethindrone Ace.  The [facility] Dr. Hyacinth MeekerMiller.  I called to confirm with the patient which pharmacy she wants.  I left a message for her to call back and confirm.  Will route once confirmed//kg

## 2015-12-09 MED ORDER — ESTRADIOL-NORETHINDRONE ACET 0.5-0.1 MG PO TABS
1.0000 | ORAL_TABLET | Freq: Every day | ORAL | Status: DC
Start: 2015-12-09 — End: 2017-03-16

## 2015-12-09 NOTE — Telephone Encounter (Signed)
Called patient to confirm her switch to [facility].  She confirmed the switch due to insurance change. Patient wasn't sure if she needs this medication.  She remembers discussing it with Dr. Hyacinth MeekerMiller, but wasn't sure what the decision was//kg  Medication refill request: Estradiol Norethindrone tablet Last AEX:  11/29/2015 MSM Next AEX: 02/11/2017 MSM Last MMG (if hormonal medication request):01/04/2015   BI-RADS CATEGORY 1: Negative.    Refill authorized: 12/03/2015 #28 tabs 13 Refills  Today: #28 Tabs 13 Refills  [facility]arm[facility] as per patient request.

## 2015-12-09 NOTE — Telephone Encounter (Signed)
She wanted to continue this at her AEX but if she wants to try and stop, then she should cut the tablets in half for a couple of months and then take every other day for another month or two and then stop.  [facility].

## 2015-12-11 NOTE — Telephone Encounter (Signed)
Called and left message for patient to call back and discuss the message from Dr. Hyacinth MeekerMiller about her Rx Estradiol.//kg

## 2015-12-17 NOTE — Telephone Encounter (Signed)
Patient called back and was given instructions per Dr. Rondel Baton message.  She understood the instructions and agreed.  She had no other questions or concerns

## 2016-02-27 ENCOUNTER — Other Ambulatory Visit: Payer: Self-pay

## 2016-02-27 DIAGNOSIS — Z1231 Encounter for screening mammogram for malignant neoplasm of breast: Secondary | ICD-10-CM

## 2016-03-13 ENCOUNTER — Ambulatory Visit: Payer: BC Managed Care – PPO

## 2016-03-17 ENCOUNTER — Telehealth: Payer: Self-pay | Admitting: Obstetrics & Gynecology

## 2016-03-17 ENCOUNTER — Encounter: Payer: Self-pay | Admitting: Obstetrics & Gynecology

## 2016-03-17 ENCOUNTER — Ambulatory Visit (INDEPENDENT_AMBULATORY_CARE_PROVIDER_SITE_OTHER): Payer: BC Managed Care – PPO | Admitting: Obstetrics & Gynecology

## 2016-03-17 VITALS — BP 110/70 | HR 74 | Resp 16 | Ht 63.0 in | Wt 190.0 lb

## 2016-03-17 DIAGNOSIS — N644 Mastodynia: Secondary | ICD-10-CM

## 2016-03-17 NOTE — Telephone Encounter (Signed)
Spoke with patient. Patient states that a week and a half ago she noticed a "sore spot" on her right breast. Denies any redness, swelling, or nipple discharge. "I am not sure if I feel something in the area or not." She is scheduled to have a screening mammogram on 03/30/2016. Advised she will need to be seen in the office for a breast check with Dr.Miller prior to her appointment on 03/30/2016 to ensure necessary imaging can be ordered and performed at her appointment. She is agreeable. Appointment scheduled for today 03/17/2016 at 1 pm with Dr.Miller. She is agreeable to date and time.  Routing to provider for final review. Patient agreeable to disposition. Will close encounter.

## 2016-03-17 NOTE — Progress Notes (Signed)
Subjective:     Patient ID: Katherine BlightSusan A Reddy, female   DOB: 06/19/1956, 60 y.o.   MRN: [id number]  HPI 60 yo G2P2 here with two weeks of right breast pain.  This is localized to the RUOQ.  Denies trauma or bruising.  Pt is exercising really regularly and continuing to work on weight loss.  She is not consuming any caffeine.  Pt thinks there may be a small "mass" associated as well.    Last MMG 01/04/15.  Pt has appt scheduled for early May for screening.  She wants to know if she should have this sooner or if there is any problem with waiting.  She will be doing a 3D MMG.  Review of Systems  All other systems reviewed and are negative.      Objective:   Physical Exam  Constitutional: She is oriented to person, place, and time. She appears well-developed and well-nourished.  Neck: Normal range of motion. Neck supple. No tracheal deviation present. No thyromegaly present.  Cardiovascular: Normal rate and regular rhythm.   Pulmonary/Chest: Effort normal and breath sounds normal. Right breast exhibits tenderness. Right breast exhibits no inverted nipple, no mass, no nipple discharge and no skin change. Left breast exhibits no inverted nipple, no mass, no nipple discharge, no skin change and no tenderness. Breasts are symmetrical.    Lymphadenopathy:    She has no cervical adenopathy.  Neurological: She is alert and oriented to person, place, and time.  Psychiatric: She has a normal mood and affect.       Assessment:     Breast tenderness.  Area of concern for pt feels like normal breast tissue.  No recent trauma.  Possible change in fit of bra due to weight loss.     Plan:     I think it is fine to wait and have screening MMG performed in about 3 weeks as scheduled.  Pt knows to check bras and see if new ones are needed.  Also, pt will let me know if pain persists past the next 4 weeks.  Also, will call if notices any changes in breast tissue.

## 2016-03-17 NOTE — Telephone Encounter (Signed)
Patient has a MMG appointment 03/30/2016 and  has a "sore spot" on her breast.  Patient is wondering if she needs to be seen by Dr.Miller before going to MMG. Patient was unable to reschedule her MMG to an earlier appointment.

## 2016-03-18 ENCOUNTER — Encounter: Payer: Self-pay | Admitting: Obstetrics & Gynecology

## 2016-03-30 ENCOUNTER — Ambulatory Visit
Admission: RE | Admit: 2016-03-30 | Discharge: 2016-03-30 | Disposition: A | Discharge status: Home / Self Care | Payer: BC Managed Care – PPO | Source: Ambulatory Visit

## 2016-03-30 DIAGNOSIS — Z1231 Encounter for screening mammogram for malignant neoplasm of breast: Secondary | ICD-10-CM

## 2016-03-31 ENCOUNTER — Other Ambulatory Visit: Payer: Self-pay | Admitting: Obstetrics & Gynecology

## 2016-03-31 DIAGNOSIS — N644 Mastodynia: Secondary | ICD-10-CM

## 2016-04-08 ENCOUNTER — Ambulatory Visit
Admission: RE | Admit: 2016-04-08 | Discharge: 2016-04-08 | Disposition: A | Discharge status: Home / Self Care | Payer: BC Managed Care – PPO | Source: Ambulatory Visit | Attending: Obstetrics & Gynecology | Admitting: Obstetrics & Gynecology

## 2016-04-08 DIAGNOSIS — N644 Mastodynia: Secondary | ICD-10-CM

## 2016-04-10 ENCOUNTER — Telehealth: Payer: Self-pay

## 2016-04-10 NOTE — Telephone Encounter (Signed)
Left message to call Brieann Osinski at [phone]. 

## 2016-04-10 NOTE — Telephone Encounter (Signed)
-----   Message from Jerene BearsMary S Miller, MD sent at 04/08/2016  3:43 PM EDT ----- Please let pt know we got MMG report and it was fine.  I would like to recheck in 4-6 weeks to make sure pain is improved and/or exam has not changed.  If in any hold, ok to remove.

## 2016-04-13 NOTE — Telephone Encounter (Signed)
Left message to call Solaris Kram at [phone]. 

## 2016-04-14 NOTE — Telephone Encounter (Signed)
Spoke with patient. Advised of message as seen below from Dr.Miller. She is agreeable and verbalizes understanding. Recheck appointment scheduled for 05/04/2016 at 4 pm with Dr.Miller. She is agreeable to date and time.  Routing to provider for final review. Patient agreeable to disposition. Will close encounter.

## 2016-05-04 ENCOUNTER — Ambulatory Visit (INDEPENDENT_AMBULATORY_CARE_PROVIDER_SITE_OTHER): Payer: BC Managed Care – PPO | Admitting: Obstetrics & Gynecology

## 2016-05-04 VITALS — BP 110/76 | HR 76 | Resp 18 | Ht 63.0 in | Wt 190.0 lb

## 2016-05-04 DIAGNOSIS — N644 Mastodynia: Secondary | ICD-10-CM

## 2016-05-04 DIAGNOSIS — N6310 Unspecified lump in the right breast, unspecified quadrant: Secondary | ICD-10-CM

## 2016-05-04 DIAGNOSIS — N63 Unspecified lump in breast: Secondary | ICD-10-CM

## 2016-05-07 ENCOUNTER — Encounter: Payer: Self-pay | Admitting: Obstetrics & Gynecology

## 2016-05-07 NOTE — Progress Notes (Signed)
GYNECOLOGY  VISIT   HPI: 60 y.o. G86P2002 Married Caucasian female with complaint of possible breast mass that she felt yesterday morning.  Pt was already scheduled for repeat breast check due to breast soreness/pain that was on the right.  This was significant enough that pt did have diagnostic imagines done 04/08/16.  Ultrasound was done with this imaging and results were negative.  Pt reports pain has improved.  She is not really sure what made it better or worse.  Denies nipple discharge or skin changes.  Yesterday morning when getting dressed, she was pretty sure she felt a lump but now cannot find it.  Area was not tender.  This was located in a similar location at the pain.  Denies recent trauma.  MMG and ultrasound was done 04/08/16 and reviewed today with pt.    GYNECOLOGIC HISTORY: No LMP recorded. Patient has had an ablation. Contraception: PMP Menopausal hormone therapy: Activella HS, generic   Patient Active Problem List   Diagnosis Date Noted  . Acid reflux 05/04/2013  . H/O laparoscopic adjustable gastric banding 10/21/2010    Past Medical History  Diagnosis Date  . Hypertension   . Crushed injury, wrist 08/2004    Right    Past Surgical History  Procedure Laterality Date  . Breast cyst aspiration Right 04/2004    @ [facility]  . Breast enhancement surgery  1983  . Tympanoplasty  12/89; 11/90  . Ablation  10/2003    HTA  . Breast implant removal  09/2006  . Laparoscopic gastric banding  11/12  . Abdominoplasty  9/13    MEDS:  Reviewed in EPIC and UTD  ALLERGIES: Review of patient's allergies indicates no known allergies.  Family History  Problem Relation Age of Onset  . Breast cancer Mother 6    dec 73-had mets to liver from breast ca  . Hypertension Mother   . Heart disease Mother   . Hypertension Father   . Heart disease Father   . Heart attack Father     SH:  Married, non smoker  Review of Systems  All other systems reviewed and are  negative.   PHYSICAL EXAMINATION:    BP 110/76 mmHg  Pulse 76  Resp 18  Ht  (1.6 m)  Wt 190 lb (86.183 kg)  BMI 33.67 kg/m2     Physical Exam  Constitutional: She is oriented to person, place, and time. She appears well-developed and well-nourished.  HENT:  Head: Normocephalic and atraumatic.  Neck: Normal range of motion. Neck supple. No tracheal deviation present. No thyromegaly present.  Cardiovascular: Normal rate and regular rhythm.   Respiratory: Effort normal and breath sounds normal. Right breast exhibits tenderness (mild). Right breast exhibits no inverted nipple, no mass, no nipple discharge and no skin change. Left breast exhibits no inverted nipple, no mass, no nipple discharge, no skin change and no tenderness. Breasts are symmetrical.    Lymphadenopathy:    She has no cervical adenopathy.  Neurological: She is alert and oriented to person, place, and time.  Skin: Skin is warm and dry.  Psychiatric: She has a normal mood and affect.    Chaperone was present for exam.  Assessment: Questionable breast mass that I, nor pt, can find today on exam Breast tenderness that is much improved Normal imaging 5/17  Plan: D/W pt continued monthly self breast exams.   Pt does desire to continue HRT so will not make any changes with this. Repeat imaging will be ordered if  there is a change on exam/physical exam findings.  Pt encouraged to call with any new concerns but she states she is a little worried that she is becoming "overanxious" about her findings on exam.  Pt reassured.

## 2016-09-29 ENCOUNTER — Telehealth: Payer: Self-pay | Admitting: Obstetrics & Gynecology

## 2016-09-29 NOTE — Telephone Encounter (Signed)
Patient having some burning and itching.

## 2016-09-29 NOTE — Telephone Encounter (Signed)
Spoke with patient. Patient states she has been having vaginal itching, odor and discomfort that started the week of 09/14/16. Denies fever, pelvic pain and vag discharge. Patient states she used monistat 1 day treatment 09/24/16 with no relief. Recommended OV for further evaluation. Advised patient Dr. Hyacinth MeekerMiller out of office 11/1, can schedule with covering provider. Patient scheduled 09/30/16 at 12:45pm with Leota Sauerseborah Leonard, CNM. Patient verbalizes understanding and is agreeable to date and time.   Routing to provider for final review. Patient is agreeable to disposition. Will close encounter.   Cc: Leota Sauerseborah Leonard, CNM

## 2016-09-30 ENCOUNTER — Ambulatory Visit (INDEPENDENT_AMBULATORY_CARE_PROVIDER_SITE_OTHER): Payer: BC Managed Care – PPO | Admitting: Certified Nurse Midwife

## 2016-09-30 ENCOUNTER — Encounter: Payer: Self-pay | Admitting: Certified Nurse Midwife

## 2016-09-30 VITALS — BP 120/86 | HR 96 | Temp 98.6°F | Resp 16 | Ht 63.0 in | Wt 196.0 lb

## 2016-09-30 DIAGNOSIS — B3731 Acute candidiasis of vulva and vagina: Secondary | ICD-10-CM

## 2016-09-30 DIAGNOSIS — B373 Candidiasis of vulva and vagina: Secondary | ICD-10-CM

## 2016-09-30 MED ORDER — NYSTATIN-TRIAMCINOLONE 100000-0.1 UNIT/GM-% EX CREA: 1.0000 "application " | TOPICAL_CREAM | Freq: Two times a day (BID) | CUTANEOUS | 0 refills | Status: DC

## 2016-09-30 MED ORDER — FLUCONAZOLE 150 MG PO TABS: 150.0000 mg | ORAL_TABLET | Freq: Once | ORAL | 0 refills | Status: AC

## 2016-09-30 NOTE — Progress Notes (Signed)
60 y.o. Married Caucasian female G2P2002 here with complaint of vaginal symptoms of itching, burning, and increase discharge. Describes discharge with slight odor.. Onset of symptoms 14 days ago. Denies new personal products or slight vaginal dryness. Tried Monistat 1 with no change in symptoms. Urinary symptoms none that she is aware of . Just feels very irritated. No pain with sexual activity. No other health issues today.   O:Healthy female WDWN Affect: normal, orientation x 3  Exam: Abdomen: soft, non  tender Lymph node: no enlargement or tenderness Pelvic exam: External genital: normal female BUS: negative Vagina: white slight thick discharge noted. Ph:4.0  ,Wet prep taken,  Cervix: normal, non tender, no CMT Uterus: normal, non tender Adnexa:normal, non tender, no masses or fullness noted   Wet Prep results: KOH, Saline positive for yeast   A:Normal pelvic exam Yeast vaginitis Yeast vulvitis Vaginal dryness   P:Discussed findings of yeast vaginitis/vulvitis and etiology. Discussed Aveeno or baking soda sitz bath for comfort. Avoid moist clothes  extended period of time. If working out in gym clothes  for long periods of time, change underwear. Coconut Oil use for skin protection prior to activity can be used to external skin for protection or dryness. Rx: Diflucan see order with instructions Rx Mycolog see order with instructions  Discussed vaginal dryness and etiology and OTC options. Patient may try coconut oil for dryness if issues.  Rv prn

## 2016-09-30 NOTE — Patient Instructions (Signed)

## 2016-10-02 ENCOUNTER — Telehealth: Payer: Self-pay | Admitting: Certified Nurse Midwife

## 2016-10-02 NOTE — Telephone Encounter (Signed)
Spoke with patient. [facility] [place] 09/30/16. Patient to f/u with pharmacy.  Advised patient return call if any additional questions or concerns.  Routing to provider for final review. Patient is agreeable to disposition. Will close encounter.

## 2016-10-02 NOTE — Telephone Encounter (Signed)
Patient says she thought there was going to be a prescription for a cream sent to the pharmacy for her.

## 2016-10-05 NOTE — Progress Notes (Signed)
Encounter reviewed Joshva Labreck, MD   

## 2016-11-19 ENCOUNTER — Telehealth: Payer: Self-pay | Admitting: Certified Nurse Midwife

## 2016-11-19 NOTE — Telephone Encounter (Signed)
Patient wants to speak with a nurse about a medication Eunice BlaseDebbie wrote her.

## 2016-11-19 NOTE — Telephone Encounter (Signed)
Spoke with patient. Patient states she was seen 09/30/16 and was prescribed diflucan x2 and cream. Patient states she took the diflucan and used the mycolog cream 2 times a day x7 days as directed. Patient states symptoms never cleared up completely. Patient states she still has vaginal itching- although not as bad- vaginal discharge off white color and creamy, with little odor. Denies any urinary complaints.  Patient states she started using the mycolog cream again with no change. Patient would like to know what she can try? Advised patient would review with Leota Sauerseborah Leonard, CNM if still in the office. Advised patient if Leota SauersDeborah Leonard, CNM not in office will review with covering provider 12/22, as Leota Sauerseborah Leonard, CNM will not be in office and return call. Patient is agreeable.    Leota Sauerseborah Leonard, CNM please advise?

## 2016-11-20 MED ORDER — TERCONAZOLE 0.4 % VA CREA: 1.0000 | TOPICAL_CREAM | Freq: Every day | VAGINAL | 0 refills | Status: AC

## 2016-11-20 NOTE — Telephone Encounter (Signed)
Lets try Terazol vagina cream HS X 7 for her.  That should cover the other kinds of yeast that Diflucan did not.

## 2016-11-20 NOTE — Telephone Encounter (Signed)
Call to patient to advise of Patty's instruction. Mailbox is full. Unable to leave message. Rx to pharmacy on file.

## 2016-11-25 NOTE — Telephone Encounter (Signed)
Spoke with patient, advised as seen below per Ria CommentPatricia Grubb, NP. Patient verbalizes understanding and is agreeable.  Routing to provider for final review. Patient is agreeable to disposition. Will close encounter.  Cc: Leota Sauerseborah Leonard, CNM

## 2017-02-11 ENCOUNTER — Ambulatory Visit: Payer: BC Managed Care – PPO | Admitting: Obstetrics & Gynecology

## 2017-02-15 NOTE — Progress Notes (Deleted)
61 y.o. 52P2002 Married Caucasian F here for annual exam.    No LMP recorded. Patient has had an ablation.          Sexually active: {yes no:314532}  The current method of family planning is ablation.    Exercising: {yes no:314532}  {types:19826} Smoker:  no  Health Maintenance: Pap:  11/01/14 negative  History of abnormal Pap:  no MMG:  04/08/16 US BIRADS 2 benign  Colonoscopy:  12/05/08- normal with Dr. Loreta AveMann- repeat 10 years  BMD:   *** TDaP:  08/01/09  Pneumonia vaccine(s):  *** Zostavax:   *** Hep C testing: *** Screening Labs: ***, Hb today: ***   reports that she has never smoked. She has never used smokeless tobacco. She reports that she drinks about 0.6 - 1.2 oz of alcohol per week . She reports that she does not use drugs.  Past Medical History:  Diagnosis Date  . Crushed injury, wrist 08/2004   Right  . Hypertension     Past Surgical History:  Procedure Laterality Date  . ABDOMINOPLASTY  9/13  . ABLATION  10/2003   HTA  . BREAST CYST ASPIRATION Right 04/2004   @ [facility]  . BREAST ENHANCEMENT SURGERY  1983  . BREAST IMPLANT REMOVAL  09/2006  . LAPAROSCOPIC GASTRIC BANDING  11/12  . TYMPANOPLASTY  12/89; 11/90    Current Outpatient Prescriptions  Medication Sig Dispense Refill  . amphetamine-dextroamphetamine (ADDERALL XR) 10 MG 24 hr capsule daily.    . citalopram (CELEXA) 20 MG tablet Take 20 mg by mouth daily.    . Estradiol-Norethindrone Acet 0.5-0.1 MG tablet Take 1 tablet by mouth daily. 84 tablet 4  . lubiprostone (AMITIZA) 24 MCG capsule Take 24 mcg by mouth daily with breakfast.     . Multiple Vitamins-Minerals (MULTIVITAMIN PO) Take by mouth daily.    Marland Kitchen. nystatin-triamcinolone (MYCOLOG II) cream Apply 1 application topically 2 (two) times daily. Apply to affected area BID for up to 7 days. 60 g 0  . olmesartan-hydrochlorothiazide (BENICAR HCT) 20-12.5 MG per tablet Take 1 tablet by mouth daily.    . RESTASIS 0.05 % ophthalmic emulsion daily.     No current  facility-administered medications for this visit.     Family History  Problem Relation Age of Onset  . Breast cancer Mother 3554    dec 73-had mets to liver from breast ca  . Hypertension Mother   . Heart disease Mother   . Hypertension Father   . Heart disease Father   . Heart attack Father     ROS:  Pertinent items are noted in HPI.  Otherwise, a comprehensive ROS was negative.  Exam:   There were no vitals taken for this visit.  Weight change: @WEIGHTCHANGE @ Height:      Ht Readings from Last 3 Encounters:  09/30/16 5\' 3"  (1.6 m)  05/04/16 5\' 3"  (1.6 m)  03/17/16 5\' 3"  (1.6 m)    General appearance: alert, cooperative and appears stated age Head: Normocephalic, without obvious abnormality, atraumatic Neck: no adenopathy, supple, symmetrical, trachea midline and thyroid {EXAM; THYROID:18604} Lungs: clear to auscultation bilaterally Breasts: {Exam; breast:13139::"normal appearance, no masses or tenderness"} Heart: regular rate and rhythm Abdomen: soft, non-tender; bowel sounds normal; no masses,  no organomegaly Extremities: extremities normal, atraumatic, no cyanosis or edema Skin: Skin color, texture, turgor normal. No rashes or lesions Lymph nodes: Cervical, supraclavicular, and axillary nodes normal. No abnormal inguinal nodes palpated Neurologic: Grossly normal   Pelvic: External genitalia:  no lesions  Urethra:  normal appearing urethra with no masses, tenderness or lesions              Bartholins and Skenes: normal                 Vagina: normal appearing vagina with normal color and discharge, no lesions              Cervix: {exam; cervix:14595}              Pap taken: {yes no:314532} Bimanual Exam:  Uterus:  {exam; uterus:12215}              Adnexa: {exam; adnexa:12223}               Rectovaginal: Confirms               Anus:  normal sphincter tone, no lesions  Chaperone was present for exam.  A:  Well Woman with normal exam  P:   {plan;  gyn:5269::"mammogram","pap smear","return annually or prn"}

## 2017-02-16 ENCOUNTER — Encounter: Payer: Self-pay | Admitting: Obstetrics & Gynecology

## 2017-02-16 ENCOUNTER — Ambulatory Visit: Payer: BC Managed Care – PPO | Admitting: Obstetrics & Gynecology

## 2017-03-16 ENCOUNTER — Ambulatory Visit (INDEPENDENT_AMBULATORY_CARE_PROVIDER_SITE_OTHER): Payer: BC Managed Care – PPO | Admitting: Obstetrics & Gynecology

## 2017-03-16 ENCOUNTER — Encounter: Payer: Self-pay | Admitting: Obstetrics & Gynecology

## 2017-03-16 ENCOUNTER — Other Ambulatory Visit (HOSPITAL_COMMUNITY)
Admission: RE | Admit: 2017-03-16 | Discharge: 2017-03-16 | Disposition: A | Discharge status: Home / Self Care | Payer: BC Managed Care – PPO | Source: Ambulatory Visit | Attending: Obstetrics & Gynecology | Admitting: Obstetrics & Gynecology

## 2017-03-16 VITALS — BP 118/78 | HR 68 | Resp 12 | Ht 62.5 in | Wt 204.0 lb

## 2017-03-16 DIAGNOSIS — Z124 Encounter for screening for malignant neoplasm of cervix: Secondary | ICD-10-CM | POA: Diagnosis not present

## 2017-03-16 DIAGNOSIS — N9089 Other specified noninflammatory disorders of vulva and perineum: Secondary | ICD-10-CM

## 2017-03-16 DIAGNOSIS — Z01419 Encounter for gynecological examination (general) (routine) without abnormal findings: Secondary | ICD-10-CM | POA: Diagnosis not present

## 2017-03-16 MED ORDER — TRIAMCINOLONE ACETONIDE 0.5 % EX OINT: 1.0000 "application " | TOPICAL_OINTMENT | Freq: Two times a day (BID) | CUTANEOUS | 0 refills | Status: DC

## 2017-03-16 MED ORDER — NYSTATIN 100000 UNIT/GM EX CREA: 1.0000 "application " | TOPICAL_CREAM | Freq: Two times a day (BID) | CUTANEOUS | 1 refills | Status: DC

## 2017-03-16 MED ORDER — ESTRADIOL-NORETHINDRONE ACET 0.5-0.1 MG PO TABS: 1.0000 | ORAL_TABLET | Freq: Every day | ORAL | 4 refills | Status: DC

## 2017-03-16 NOTE — Progress Notes (Signed)
61 y.o. [id number] MarriedCaucasianF here for annual exam.  Reports she is doing well.  Husband diagnosed with lymphoma, B cell.  He has completed his chemotherapy.  This was done at [facility].  He is officially in remission but has just finished his chemo.    Denies vaginal bleeding.    No LMP recorded. Patient has had an ablation.          Sexually active: Yes.    The current method of family planning is ablation.    Exercising: No.  The patient does not participate in regular exercise at present. Smoker:  no  Health Maintenance: Pap:  11-01-14 Neg, 2013 Neg HR HPV History of abnormal Pap:  no MMG:  04/08/16 Dx Bilateral Mammogram & U/S of R Breast  BIRADS2, Density B Colonoscopy:  12-05-2008 normal with Dr. Loreta Ave, next due 11/2018. BMD:   n/a TDaP:  2010 Screening Labs: PCP, Hb today: PCP, Urine today: PCP   reports that she has never smoked. She has never used smokeless tobacco. She reports that she drinks about 0.6 - 1.2 oz of alcohol per week . She reports that she does not use drugs.  Past Medical History:  Diagnosis Date  . Crushed injury, wrist 08/2004   Right  . Hypertension     Past Surgical History:  Procedure Laterality Date  . ABDOMINOPLASTY  9/13  . ABLATION  10/2003   HTA  . BREAST CYST ASPIRATION Right 04/2004   @ [facility]  . BREAST ENHANCEMENT SURGERY  1983  . BREAST IMPLANT REMOVAL  09/2006  . LAPAROSCOPIC GASTRIC BANDING  11/12  . TYMPANOPLASTY  12/89; 11/90    Current Outpatient Prescriptions  Medication Sig Dispense Refill  . amphetamine-dextroamphetamine (ADDERALL XR) 10 MG 24 hr capsule daily.    . citalopram (CELEXA) 20 MG tablet Take 20 mg by mouth daily.    . Estradiol-Norethindrone Acet 0.5-0.1 MG tablet Take 1 tablet by mouth daily. 84 tablet 4  . lubiprostone (AMITIZA) 24 MCG capsule Take 24 mcg by mouth daily with breakfast.     . Multiple Vitamins-Minerals (MULTIVITAMIN PO) Take by mouth daily.    Marland Kitchen olmesartan-hydrochlorothiazide (BENICAR HCT) 20-12.5 MG per  tablet Take 1 tablet by mouth daily.    . RESTASIS 0.05 % ophthalmic emulsion daily.     No current facility-administered medications for this visit.     Family History  Problem Relation Age of Onset  . Breast cancer Mother 18    dec 73-had mets to liver from breast ca  . Hypertension Mother   . Heart disease Mother   . Hypertension Father   . Heart disease Father   . Heart attack Father     ROS:  Pertinent items are noted in HPI.  Otherwise, a comprehensive ROS was negative.  Exam:   BP 118/78 (BP Location: Right Arm, Patient Position: Sitting, Cuff Size: Normal)   Pulse 68   Resp 12   Ht 5' 2.5" (1.588 m)   Wt 204 lb (92.5 kg)   BMI 36.72 kg/m     Height: 5' 2.5" (158.8 cm)  Ht Readings from Last 3 Encounters:  03/16/17 5' 2.5" (1.588 m)  09/30/16  (1.6 m)  05/04/16  (1.6 m)    General appearance: alert, cooperative and appears stated age Head: Normocephalic, without obvious abnormality, atraumatic Neck: no adenopathy, supple, symmetrical, trachea midline and thyroid normal to inspection and palpation Lungs: clear to auscultation bilaterally Breasts: normal appearance, no masses or tenderness Heart: regular rate  and rhythm Abdomen: soft, non-tender; bowel sounds normal; no masses,  no organomegaly Extremities: extremities normal, atraumatic, no cyanosis or edema Skin: Skin color, texture, turgor normal. No rashes or lesions Lymph nodes: Cervical, supraclavicular, and axillary nodes normal. No abnormal inguinal nodes palpated Neurologic: Grossly normal   Pelvic:  External genitalia:  Pigmented, floppy, inner left labia majora lesion; evidence of itching in perineal body             Urethra:  normal appearing urethra with no masses, tenderness or lesions              Bartholins and Skenes: normal                 Vagina: normal appearing vagina with normal color and discharge, no lesions              Cervix: no lesions              Pap taken: Yes.    Bimanual Exam:  Uterus:  normal size, contour, position, consistency, mobility, non-tender              Adnexa: normal adnexa and no mass, fullness, tenderness               Rectovaginal: Confirms               Anus:  normal sphincter tone, no lesions  Recommended removal of lesion.  Pt in agreement.  Verbal consent obtained.  Area cleansed with betadine x 3.  1cc % Lidocaine instilled.  Lot: [id number].  Exp:  2/21.  Using sterile technique, lesion fully excised and sent for pathology.  Silver nitrate used for excellent hemostasis.  Pt tolerated procedure well.    Chaperone was present for exam.  A:  Well Woman with normal exam PMP, on HRT Hyertension Family hx of breast cancer, mother aged 56.  Died aged 63 at passing. Skin yeast under breast Vulvar lesion s/p excision today Vulvar itching  P:   Mammogram guidelines reviewed pap smear and HR HPV obtained today Desirous of continuous HRT.  0.5/1.0mg  daily.  #84/4RF Labs with Dr. Ouida Sills Nystatin cream rx to pharmacy.  #30g/1RF. Triamcinolone 0.05% ointment bid up to 7 days to affected area.  Pt advised about soaps, toilet papers, pads, detergents.  She will make changes and call if symptoms do not resolve. Vulvar pathology will be called to pt. return annually or prn

## 2017-03-18 LAB — CYTOLOGY - PAP
Diagnosis: NEGATIVE
HPV: NOT DETECTED

## 2017-03-19 ENCOUNTER — Telehealth: Payer: Self-pay | Admitting: *Deleted

## 2017-03-19 NOTE — Telephone Encounter (Signed)
Spoke with patient, advised of results as seen below per Dr. Hyacinth Meeker. Patient verbalizes understanding and is agreeable.  Routing to provider for final review. Patient is agreeable to disposition. Will close encounter.

## 2017-03-19 NOTE — Telephone Encounter (Signed)
Left message to call Mahlia Fernando at [phone].  

## 2017-03-19 NOTE — Telephone Encounter (Signed)
-----   Message from Jerene Bears, MD sent at 03/18/2017  5:25 PM EDT ----- Please let pt know that the area I removed from her labia was just a skin tag.  Nothing else needs to be done unless having new issues/concerns.

## 2017-04-15 DIAGNOSIS — G8929 Other chronic pain: Secondary | ICD-10-CM | POA: Insufficient documentation

## 2017-04-15 DIAGNOSIS — M25531 Pain in right wrist: Secondary | ICD-10-CM

## 2017-04-15 DIAGNOSIS — S52501A Unspecified fracture of the lower end of right radius, initial encounter for closed fracture: Secondary | ICD-10-CM | POA: Insufficient documentation

## 2017-05-25 ENCOUNTER — Other Ambulatory Visit: Payer: Self-pay | Admitting: Obstetrics & Gynecology

## 2017-05-25 DIAGNOSIS — Z1231 Encounter for screening mammogram for malignant neoplasm of breast: Secondary | ICD-10-CM

## 2017-06-07 ENCOUNTER — Ambulatory Visit: Payer: BC Managed Care – PPO

## 2017-06-14 ENCOUNTER — Ambulatory Visit: Payer: BC Managed Care – PPO

## 2017-06-21 ENCOUNTER — Ambulatory Visit: Payer: BC Managed Care – PPO

## 2017-06-23 ENCOUNTER — Other Ambulatory Visit: Payer: Self-pay | Admitting: *Deleted

## 2017-06-23 NOTE — Telephone Encounter (Signed)
Medication refill request: estradiol  Last AEX:  03/16/17 SM Next AEX: 05/31/18 SM  Last MMG (if hormonal medication request): 04/08/16 US right BIRADS2:benign  Refill authorized: 03/16/17 #[id number] to local [facility]    Called patient. She request new Rx sent to [facility]. She is also overdue for MMG and states she will schedule it today.  Do you approve 1 year supply to new pharmacy?

## 2017-06-25 MED ORDER — ESTRADIOL-NORETHINDRONE ACET 0.5-0.1 MG PO TABS: 1.0000 | ORAL_TABLET | Freq: Every day | ORAL | 0 refills | Status: DC

## 2017-06-25 NOTE — Telephone Encounter (Signed)
RX done for 3 month supply with no RFs.  Ok to close encounter.

## 2017-07-12 ENCOUNTER — Ambulatory Visit
Admission: RE | Admit: 2017-07-12 | Discharge: 2017-07-12 | Disposition: A | Discharge status: Home / Self Care | Payer: BC Managed Care – PPO | Source: Ambulatory Visit | Attending: Obstetrics & Gynecology | Admitting: Obstetrics & Gynecology

## 2017-07-12 DIAGNOSIS — Z1231 Encounter for screening mammogram for malignant neoplasm of breast: Secondary | ICD-10-CM

## 2017-07-21 ENCOUNTER — Other Ambulatory Visit: Payer: Self-pay | Admitting: Obstetrics & Gynecology

## 2017-07-21 NOTE — Telephone Encounter (Signed)
Medication refill request: Kenalog Cream  Last AEX:  03-16-17  Next AEX: 05-31-18  Last MMG (if hormonal medication request): 07-12-17 WNL  Refill authorized: please advise

## 2017-07-21 NOTE — Telephone Encounter (Signed)
Will defer to Dr Hyacinth Meeker who will be here tomorrow. I don't know if she will want to see her or not.

## 2017-07-22 NOTE — Telephone Encounter (Signed)
Please contact pt.  She was given this before a skin tag was removed.  Can you see if she needs this or if this was an error.  If needs, should not be from the prior skin tag so needs OV.

## 2017-07-22 NOTE — Telephone Encounter (Signed)
Spoke with patient she is wanting the RX for itching. Patient offered appointment- patient denied and said "just forget it about it". I apologized to the patient and she said she would get it from her PCP. -eh

## 2017-09-27 ENCOUNTER — Other Ambulatory Visit: Payer: Self-pay | Admitting: Obstetrics & Gynecology

## 2017-09-28 NOTE — Telephone Encounter (Signed)
Medication refill request: Katherine Young  Last AEX:  03/16/17 SM Next AEX: 05/31/18 SM Last MMG (if hormonal medication request): 07/13/17 BIRADS1:neg  Refill authorized: 06/25/17 #[id number]. Today please advise.

## 2017-10-12 ENCOUNTER — Telehealth: Payer: Self-pay | Admitting: Obstetrics & Gynecology

## 2017-10-12 NOTE — Telephone Encounter (Signed)
Call to patient. Patient states that she has had a sharp pain in her right breast that she has had for a few months, but has just recently started becoming more constant. Patient states it is tender to the touch. She states she feels it is concentrated near the "raised places" around her nipple area. Advised office visit would be needed for further evaluation. Patient agreeable. Office visit scheduled for Thursday 10/14/17 at 1100, time per Kennon RoundsSally, Charity fundraiserN. Patient agreeable to date and time of appointment. Patient aware she will be worked in to schedule, so there may be an additional wait time. Patient agreeable.   Routing to provider for final review. Patient agreeable to disposition. Will close encounter.

## 2017-10-12 NOTE — Telephone Encounter (Signed)
Patient is experiencing right breast pain that is pretty constant.  It is stabbing and when she touches around her nipple it is a sharp pain.  No discharge but a raised place.  Patient states pain has been going on for a couple of months now.

## 2017-10-14 ENCOUNTER — Ambulatory Visit: Payer: BC Managed Care – PPO | Admitting: Obstetrics & Gynecology

## 2017-10-14 ENCOUNTER — Encounter: Payer: Self-pay | Admitting: Obstetrics & Gynecology

## 2017-10-14 ENCOUNTER — Other Ambulatory Visit: Payer: Self-pay

## 2017-10-14 VITALS — BP 126/70 | HR 96 | Resp 16 | Ht 62.5 in | Wt 210.0 lb

## 2017-10-14 DIAGNOSIS — N644 Mastodynia: Secondary | ICD-10-CM | POA: Diagnosis not present

## 2017-10-14 NOTE — Progress Notes (Signed)
GYNECOLOGY  VISIT  CC:   Right breast pain  HPI: 61 y.o. [id number] Married Caucasian female here for complaint of right breast pain.  Pain is present all the time and has been present for the last two months.  At times, she feels burning or irritation.  Having some issues with skin changes on upper chest c/w acne.  Saw Dr. Lovenia KimSteinhelfer who diagnosed folliculitis.  Was treated with Keflex and this resolved the skin issues.  So, she doesn't think this is related.  Drinks a cup of coffee each morning.  Will have occasional tea or diet coke.  Doesn't eat a lot of chocolate.  Has not recently changed bras.  Denies recent trauma.  GYNECOLOGIC HISTORY: No LMP recorded. Patient has had an ablation. Contraception: PMP Menopausal hormone therapy: none  Patient Active Problem List   Diagnosis Date Noted  . Acid reflux 05/04/2013  . H/O laparoscopic adjustable gastric banding 10/21/2010    Past Medical History:  Diagnosis Date  . Crushed injury, wrist 08/2004   Right  . Hypertension     Past Surgical History:  Procedure Laterality Date  . ABDOMINOPLASTY  9/13  . ABLATION  10/2003   HTA  . BREAST CYST ASPIRATION Right 04/2004   @ [facility]  . BREAST ENHANCEMENT SURGERY  1983  . BREAST IMPLANT REMOVAL  09/2006  . LAPAROSCOPIC GASTRIC BANDING  11/12  . TYMPANOPLASTY  12/89; 11/90    MEDS:   Current Outpatient Medications on File Prior to Visit  Medication Sig Dispense Refill  . AMABELZ 0.5-0.1 MG tablet TAKE 1 TABLET DAILY 84 tablet 3  . amphetamine-dextroamphetamine (ADDERALL XR) 10 MG 24 hr capsule daily.    Mack Guise. ARMOUR THYROID 30 MG tablet Take 1 tablet daily by mouth.  2  . cephALEXin (KEFLEX) 500 MG capsule Take 500 mg 2 (two) times daily by mouth.  0  . citalopram (CELEXA) 20 MG tablet Take 20 mg by mouth daily.    Marland Kitchen. lubiprostone (AMITIZA) 24 MCG capsule Take 24 mcg by mouth daily with breakfast.     . Multiple Vitamins-Minerals (MULTIVITAMIN PO) Take by mouth daily.    Marland Kitchen.  olmesartan-hydrochlorothiazide (BENICAR HCT) 20-12.5 MG per tablet Take 1 tablet by mouth daily.    . RESTASIS 0.05 % ophthalmic emulsion daily.    Marland Kitchen. triamcinolone ointment (KENALOG) 0.5 % Apply 1 application topically 2 (two) times daily. Do not use for more than 5 days. 30 g 0   No current facility-administered medications on file prior to visit.     ALLERGIES: Patient has no known allergies.  Family History  Problem Relation Age of Onset  . Breast cancer Mother 4154       dec 73-had mets to liver from breast ca  . Hypertension Mother   . Heart disease Mother   . Hypertension Father   . Heart disease Father   . Heart attack Father     SH:  Married, non smoker  Review of Systems  Endo/Heme/Allergies:       Breast pain on right  All other systems reviewed and are negative.   PHYSICAL EXAMINATION:    BP 126/70 (BP Location: Right Arm, Patient Position: Sitting, Cuff Size: Normal)   Pulse 96   Resp 16   Ht 5' 2.5" (1.588 m)   Wt 210 lb (95.3 kg)   BMI 37.80 kg/m     Physical Exam  Constitutional: She appears well-developed and well-nourished.  Neck: Normal range of motion. Neck supple. No thyromegaly present.  Respiratory: Right breast exhibits tenderness. Right breast exhibits no inverted nipple, no mass, no nipple discharge and no skin change. Left breast exhibits no inverted nipple, no mass, no nipple discharge, no skin change and no tenderness. Breasts are symmetrical.    Lymphadenopathy:    She has no cervical adenopathy.    She has no axillary adenopathy.       Right: No supraclavicular adenopathy present.       Left: No supraclavicular adenopathy present.   Chaperone was present for exam.  Assessment: Right breast pain of two months duration, concerning to pt, with no abnormal findings on physical exam except for the tenderness  Plan: Will plan diagnostic imaging of the right breast.  Pt very comfortable with plan.   ~15 minutes spent with patient >50% of  time was in face to face discussion of above.

## 2017-10-14 NOTE — Progress Notes (Signed)
Scheduled patient while in office for right [facility] 1:50pm. Patient declines earlier appointments due to travel. Placed in mammogram hold.

## 2017-10-25 ENCOUNTER — Ambulatory Visit: Payer: BC Managed Care – PPO

## 2017-10-25 ENCOUNTER — Ambulatory Visit
Admission: RE | Admit: 2017-10-25 | Discharge: 2017-10-25 | Disposition: A | Discharge status: Home / Self Care | Payer: BC Managed Care – PPO | Source: Ambulatory Visit | Attending: Obstetrics & Gynecology | Admitting: Obstetrics & Gynecology

## 2017-10-25 DIAGNOSIS — N644 Mastodynia: Secondary | ICD-10-CM

## 2017-11-16 DIAGNOSIS — M67431 Ganglion, right wrist: Secondary | ICD-10-CM | POA: Insufficient documentation

## 2018-05-30 NOTE — Progress Notes (Signed)
62 y.o. [id number] MarriedCaucasianF here for annual exam.  Doing well.  Denies vaginal bleeding.  Had a diagnostic mammogram in November.  Routine is due in August.    PCP:  Dr. Ouida SillsFagan.  Does blood work once yearly.  Last visit was about 3 months ago.  No LMP recorded. Patient has had an ablation.          Sexually active: Yes.    The current method of family planning is ablation.    Exercising: No.  The patient does not participate in regular exercise at present. Smoker:  no  Health Maintenance: Pap:  03-16-17 negative, HR HPV not dected           11-01-14 negative  History of abnormal Pap:  no MMG:  10-25-17 right breast diagnostic- BIRADS 1 negative  Colonoscopy:  12-05-08 normal with Dr. Loreta AveMann, next due 11-2018 BMD:   n/a TDaP: 10/07/17  Pneumonia vaccine(s):  no Shingrix:   D/w pt today the new shingrix vaccine Hep C testing: unsure, may have done with employer Screening Labs: PCP, Hb today: PCP, Urine today: not collected   reports that she has never smoked. She has never used smokeless tobacco. She reports that she drinks about 0.6 - 1.2 oz of alcohol per week. She reports that she does not use drugs.  Past Medical History:  Diagnosis Date  . Crushed injury, wrist 08/2004   Right  . Hypertension     Past Surgical History:  Procedure Laterality Date  . ABDOMINOPLASTY  9/13  . ABLATION  10/2003   HTA  . BREAST CYST ASPIRATION Right 04/2004   @ [facility]  . BREAST ENHANCEMENT SURGERY  1983  . BREAST IMPLANT REMOVAL  09/2006  . LAPAROSCOPIC GASTRIC BANDING  11/12  . REDUCTION MAMMAPLASTY Bilateral   . TYMPANOPLASTY  12/89; 11/90    Current Outpatient Medications  Medication Sig Dispense Refill  . AMABELZ 0.5-0.1 MG tablet TAKE 1 TABLET DAILY 84 tablet 3  . amphetamine-dextroamphetamine (ADDERALL XR) 10 MG 24 hr capsule daily.    . citalopram (CELEXA) 20 MG tablet Take 20 mg by mouth daily.    . clindamycin (CLEOCIN T) 1 % lotion     . losartan-hydrochlorothiazide (HYZAAR)  100-12.5 MG tablet     . lubiprostone (AMITIZA) 24 MCG capsule Take 24 mcg by mouth daily with breakfast.     . Multiple Vitamins-Minerals (MULTIVITAMIN PO) Take by mouth daily.    Marland Kitchen. olmesartan-hydrochlorothiazide (BENICAR HCT) 20-12.5 MG per tablet Take 1 tablet by mouth daily.    . RESTASIS 0.05 % ophthalmic emulsion daily.     No current facility-administered medications for this visit.     Family History  Problem Relation Age of Onset  . Breast cancer Mother 3154       dec 73-had mets to liver from breast ca  . Hypertension Mother   . Heart disease Mother   . Hypertension Father   . Heart disease Father   . Heart attack Father     Review of Systems  Genitourinary:       Vulvar/vaginal itching   Skin: Positive for rash.  All other systems reviewed and are negative.   Exam:   BP 96/60 (BP Location: Right Arm, Patient Position: Sitting, Cuff Size: Normal)   Pulse 72   Resp 12   Ht 5' 2.75" (1.594 m)   Wt 193 lb (87.5 kg)   BMI 34.46 kg/m   Height:   Height: 5' 2.75" (159.4 cm)  Ht Readings  from Last 3 Encounters:  05/31/18 5' 2.75" (1.594 m)  10/14/17 5' 2.5" (1.588 m)  03/16/17 5' 2.5" (1.588 m)    General appearance: alert, cooperative and appears stated age Head: Normocephalic, without obvious abnormality, atraumatic Neck: no adenopathy, supple, symmetrical, trachea midline and thyroid normal to inspection and palpation Lungs: clear to auscultation bilaterally Breasts: normal appearance, no masses or tenderness Heart: regular rate and rhythm Abdomen: soft, non-tender; bowel sounds normal; no masses,  no organomegaly Extremities: extremities normal, atraumatic, no cyanosis or edema Skin: Skin color, texture, turgor normal. No rashes or lesions Lymph nodes: Cervical, supraclavicular, and axillary nodes normal. No abnormal inguinal nodes palpated Neurologic: Grossly normal  Pelvic: External genitalia:  no lesions              Urethra:  normal appearing urethra  with no masses, tenderness or lesions              Bartholins and Skenes: normal                 Vagina: normal appearing vagina with normal color and discharge, no lesions              Cervix: no lesions              Pap taken: No. Bimanual Exam:  Uterus:  normal size, contour, position, consistency, mobility, non-tender              Adnexa: normal adnexa and no mass, fullness, tenderness               Rectovaginal: Confirms               Anus:  normal sphincter tone, no lesions  Chaperone was present for exam.  A:  Well Woman with normal exam'PMP, on HRT Family hx of breast cancer in her mother, dx age 55 and died 29 Chronic skin year Hypertension ADD  P:   Mammogram guidelines reviewed.  Doing 3D.  Aware due this summer. pap smear not indicated OTC antifungal products discussed Hep C antibody will be obtained today.  Remaining labs done with PCP. RF for HRT to mail order pharmacy.  Desires to continue for now.  Risks reviewed. Return annually or prn

## 2018-05-31 ENCOUNTER — Ambulatory Visit (INDEPENDENT_AMBULATORY_CARE_PROVIDER_SITE_OTHER): Payer: BC Managed Care – PPO | Admitting: Obstetrics & Gynecology

## 2018-05-31 ENCOUNTER — Encounter: Payer: Self-pay | Admitting: Obstetrics & Gynecology

## 2018-05-31 ENCOUNTER — Other Ambulatory Visit: Payer: Self-pay

## 2018-05-31 VITALS — BP 96/60 | HR 72 | Resp 12 | Ht 62.75 in | Wt 193.0 lb

## 2018-05-31 DIAGNOSIS — Z205 Contact with and (suspected) exposure to viral hepatitis: Secondary | ICD-10-CM

## 2018-05-31 DIAGNOSIS — Z01419 Encounter for gynecological examination (general) (routine) without abnormal findings: Secondary | ICD-10-CM | POA: Diagnosis not present

## 2018-05-31 MED ORDER — ESTRADIOL-NORETHINDRONE ACET 0.5-0.1 MG PO TABS: 1.0000 | ORAL_TABLET | Freq: Every day | ORAL | 4 refills | Status: DC

## 2018-05-31 NOTE — Patient Instructions (Signed)
For the skin issues--Lamisil or Lotrimin over the counter cream

## 2018-06-01 LAB — HEPATITIS C ANTIBODY: Hep C Virus Ab: <0.1 s/co ratio (ref 0.0–0.9)

## 2018-07-06 ENCOUNTER — Other Ambulatory Visit: Payer: Self-pay | Admitting: Obstetrics & Gynecology

## 2018-07-06 DIAGNOSIS — Z1231 Encounter for screening mammogram for malignant neoplasm of breast: Secondary | ICD-10-CM

## 2018-08-09 ENCOUNTER — Ambulatory Visit
Admission: RE | Admit: 2018-08-09 | Discharge: 2018-08-09 | Disposition: A | Discharge status: Home / Self Care | Payer: BC Managed Care – PPO | Source: Ambulatory Visit | Attending: Obstetrics & Gynecology | Admitting: Obstetrics & Gynecology

## 2018-08-09 DIAGNOSIS — Z1231 Encounter for screening mammogram for malignant neoplasm of breast: Secondary | ICD-10-CM

## 2018-11-04 ENCOUNTER — Other Ambulatory Visit (HOSPITAL_COMMUNITY): Payer: Self-pay | Admitting: Internal Medicine

## 2018-11-04 ENCOUNTER — Ambulatory Visit (HOSPITAL_COMMUNITY)
Admission: RE | Admit: 2018-11-04 | Discharge: 2018-11-04 | Disposition: A | Discharge status: Home / Self Care | Payer: BC Managed Care – PPO | Source: Ambulatory Visit | Attending: Internal Medicine | Admitting: Internal Medicine

## 2018-11-04 DIAGNOSIS — M25562 Pain in left knee: Secondary | ICD-10-CM | POA: Diagnosis present

## 2018-11-04 DIAGNOSIS — M79672 Pain in left foot: Secondary | ICD-10-CM | POA: Insufficient documentation

## 2019-08-05 ENCOUNTER — Other Ambulatory Visit: Payer: Self-pay | Admitting: Obstetrics & Gynecology

## 2019-08-08 NOTE — Telephone Encounter (Signed)
Medication refill request: Katherine Young Last AEX:  05/31/2018 SM Next AEX: 09/14/2019 Last MMG (if hormonal medication request): 08/09/2018 BIRADS 1 Negative Density B Refill authorized: Pending authorization. #[id number] with no refills if appropriate. Please advise.

## 2019-09-12 ENCOUNTER — Other Ambulatory Visit: Payer: Self-pay

## 2019-09-13 ENCOUNTER — Other Ambulatory Visit: Payer: Self-pay | Admitting: Obstetrics & Gynecology

## 2019-09-13 DIAGNOSIS — Z1231 Encounter for screening mammogram for malignant neoplasm of breast: Secondary | ICD-10-CM

## 2019-09-14 ENCOUNTER — Encounter: Payer: Self-pay | Admitting: Obstetrics & Gynecology

## 2019-09-14 ENCOUNTER — Other Ambulatory Visit: Payer: Self-pay

## 2019-09-14 ENCOUNTER — Ambulatory Visit: Payer: BC Managed Care – PPO | Admitting: Obstetrics & Gynecology

## 2019-09-14 VITALS — BP 110/70 | HR 80 | Temp 97.7°F | Ht 62.5 in | Wt 208.0 lb

## 2019-09-14 DIAGNOSIS — Z01419 Encounter for gynecological examination (general) (routine) without abnormal findings: Secondary | ICD-10-CM

## 2019-09-14 DIAGNOSIS — R7989 Other specified abnormal findings of blood chemistry: Secondary | ICD-10-CM | POA: Diagnosis not present

## 2019-09-14 DIAGNOSIS — E038 Other specified hypothyroidism: Secondary | ICD-10-CM

## 2019-09-14 DIAGNOSIS — E039 Hypothyroidism, unspecified: Secondary | ICD-10-CM

## 2019-09-14 NOTE — Progress Notes (Signed)
63 y.o. G57P2002 Married White or Caucasian female here for annual exam.  She is going to retire at the end of the year.  She's really happy about this.  Daughter just had a baby in September.  Pt feels she needs to be more "mobile".    Denies vaginal bleeding.  Colonoscopy is scheduled for 10/13/2019 with Dr. Collene Mares.  TSH was elevated.  Has noticed increase hair loss, increased constipation, and fatigue.    She is seeing a nutritionist at [facility] and taking Qsymia.  She has been on this for two months.  Has follow in a week or two.    No LMP recorded. Patient has had an ablation.          Sexually active: Yes.    The current method of family planning is post menopausal status.    Exercising: No.   Smoker:  no  Health Maintenance: Pap:  03/16/17 Neg. HR HPV:neg   11/01/14 Neg  History of abnormal Pap:  no MMG:  08/09/18 BIRADS1:neg. Has appt 10/31/19 Colonoscopy:  2010 Normal.  Scheduled 10/13/19 Dr. Collene Mares BMD:   Never TDaP:  2018 Pneumonia vaccine(s):  n/a Shingrix:   Considering this after she retires  Flu vacc: done 08/11/19 Hep C testing: 05/31/18 Neg  Screening Labs: PCP   reports that she has never smoked. She has never used smokeless tobacco. She reports current alcohol use of about 1.0 - 2.0 standard drinks of alcohol per week. She reports that she does not use drugs.  Past Medical History:  Diagnosis Date  . Crushed injury, wrist 08/2004   Right  . Hypertension     Past Surgical History:  Procedure Laterality Date  . ABDOMINOPLASTY  9/13  . ABLATION  10/2003   HTA  . BREAST CYST ASPIRATION Right 04/2004   @ [facility]  . BREAST ENHANCEMENT SURGERY  1983  . BREAST IMPLANT REMOVAL  09/2006  . LAPAROSCOPIC GASTRIC BANDING  11/12  . REDUCTION MAMMAPLASTY Bilateral    implants removed  . TYMPANOPLASTY  12/89; 11/90    Current Outpatient Medications  Medication Sig Dispense Refill  . AMABELZ 0.5-0.1 MG tablet TAKE 1 TABLET DAILY 84 tablet 0  . amphetamine-dextroamphetamine  (ADDERALL XR) 10 MG 24 hr capsule daily.    . citalopram (CELEXA) 20 MG tablet Take 20 mg by mouth daily.    Marland Kitchen lubiprostone (AMITIZA) 24 MCG capsule Take 24 mcg by mouth daily with breakfast.     . olmesartan-hydrochlorothiazide (BENICAR HCT) 20-12.5 MG per tablet Take 1 tablet by mouth daily.    Marland Kitchen QSYMIA 7.5-46 MG CP24 Take 1 tablet by mouth daily.    Marland Kitchen XIIDRA 5 % SOLN Place 1 drop into both eyes daily.    . valsartan-hydrochlorothiazide (DIOVAN-HCT) 160-12.5 MG tablet Take 1 tablet by mouth daily.     No current facility-administered medications for this visit.     Family History  Problem Relation Age of Onset  . Breast cancer Mother 68       dec 73-had mets to liver from breast ca  . Hypertension Mother   . Heart disease Mother   . Hypertension Father   . Heart disease Father   . Heart attack Father     Review of Systems  Psychiatric/Behavioral: Positive for sleep disturbance.  All other systems reviewed and are negative.   Exam:   BP 110/70   Pulse 80   Temp 97.7 F (36.5 C) (Temporal)   Ht 5' 2.5" (1.588 m)  Wt 208 lb (94.3 kg)   BMI 37.44 kg/m   Height: 5' 2.5" (158.8 cm)  Ht Readings from Last 3 Encounters:  09/14/19 5' 2.5" (1.588 m)  05/31/18 5' 2.75" (1.594 m)  10/14/17 5' 2.5" (1.588 m)    General appearance: alert, cooperative and appears stated age Head: Normocephalic, without obvious abnormality, atraumatic Neck: no adenopathy, supple, symmetrical, trachea midline and thyroid normal to inspection and palpation Lungs: clear to auscultation bilaterally Breasts: normal appearance, no masses or tenderness Heart: regular rate and rhythm Abdomen: soft, non-tender; bowel sounds normal; no masses,  no organomegaly Extremities: extremities normal, atraumatic, no cyanosis or edema Skin: Skin color, texture, turgor normal. No rashes or lesions Lymph nodes: Cervical, supraclavicular, and axillary nodes normal. No abnormal inguinal nodes palpated Neurologic:  Grossly normal   Pelvic: External genitalia:  no lesions              Urethra:  normal appearing urethra with no masses, tenderness or lesions              Bartholins and Skenes: normal                 Vagina: normal appearing vagina with normal color and discharge, no lesions              Cervix: no lesions              Pap taken: No. Bimanual Exam:  Uterus:  normal size, contour, position, consistency, mobility, non-tender              Adnexa: normal adnexa and no mass, fullness, tenderness               Rectovaginal: Confirms               Anus:  normal sphincter tone, no lesions  Chaperone was present for exam.  A:  Well Woman with normal exam PMP, on HRT Family hx of breast cancer in her mother, dx age 31 and died age 78 Hypertension ADD Constipation  P:   Mammogram guidelines reviewed.  Has this scheduled in December.   Pap and neg HR HPV 2018.  Not indicated today. TSH and free T4 obtained today Has colonoscopy scheduled Desires to continue HRT.  Will need RF. Return annually or prn

## 2019-09-16 LAB — TSH: TSH: 7.6 u[IU]/mL — ABNORMAL HIGH (ref 0.450–4.500)

## 2019-09-16 LAB — T4, FREE: Free T4: 0.84 ng/dL (ref 0.82–1.77)

## 2019-09-20 NOTE — Addendum Note (Signed)
Addended by: Megan Salon on: 09/20/2019 11:36 AM   Modules accepted: Orders

## 2019-10-26 ENCOUNTER — Other Ambulatory Visit: Payer: Self-pay | Admitting: Obstetrics & Gynecology

## 2019-10-30 NOTE — Telephone Encounter (Signed)
Medication refill request: Katherine Young  Last AEX:  09-14-2019 SM  Next AEX: 12-13-20  Last MMG (if hormonal medication request): 08-09-18 BIRADS 1 negative, scheduled 11-20-2019 Refill authorized: Today, please advise.   Medication pended for #84, 0RF. Please refill if appropriate.

## 2019-11-08 ENCOUNTER — Ambulatory Visit (HOSPITAL_COMMUNITY)
Admission: RE | Admit: 2019-11-08 | Discharge: 2019-11-08 | Disposition: A | Discharge status: Home / Self Care | Payer: BC Managed Care – PPO | Source: Ambulatory Visit | Attending: Internal Medicine | Admitting: Internal Medicine

## 2019-11-08 ENCOUNTER — Other Ambulatory Visit: Payer: Self-pay

## 2019-11-08 ENCOUNTER — Other Ambulatory Visit (HOSPITAL_COMMUNITY): Payer: Self-pay | Admitting: Internal Medicine

## 2019-11-08 DIAGNOSIS — M25552 Pain in left hip: Secondary | ICD-10-CM

## 2019-11-08 DIAGNOSIS — M542 Cervicalgia: Secondary | ICD-10-CM | POA: Diagnosis present

## 2019-11-09 ENCOUNTER — Other Ambulatory Visit (INDEPENDENT_AMBULATORY_CARE_PROVIDER_SITE_OTHER): Payer: BC Managed Care – PPO

## 2019-11-09 ENCOUNTER — Encounter: Payer: Self-pay | Admitting: Internal Medicine

## 2019-11-09 ENCOUNTER — Ambulatory Visit: Payer: BC Managed Care – PPO | Admitting: Internal Medicine

## 2019-11-09 VITALS — BP 118/70 | HR 92 | Ht 62.5 in | Wt 212.0 lb

## 2019-11-09 DIAGNOSIS — E039 Hypothyroidism, unspecified: Secondary | ICD-10-CM

## 2019-11-09 LAB — T4, FREE: Free T4: 0.68 ng/dL (ref 0.60–1.60)

## 2019-11-09 LAB — TSH: TSH: 5.81 u[IU]/mL — ABNORMAL HIGH (ref 0.35–4.50)

## 2019-11-09 LAB — T3, FREE: T3, Free: 2.8 pg/mL (ref 2.3–4.2)

## 2019-11-09 NOTE — Patient Instructions (Signed)
Please stop at [facility] lab.  Please return in 4 months.  Hypothyroidism  Hypothyroidism is when the thyroid gland does not make enough of certain hormones (it is underactive). The thyroid gland is a small gland located in the lower front part of the neck, just in front of the windpipe (trachea). This gland makes hormones that help control how the body uses food for energy (metabolism) as well as how the heart and brain function. These hormones also play a role in keeping your bones strong. When the thyroid is underactive, it produces too little of the hormones thyroxine (T4) and triiodothyronine (T3). What are the causes? This condition may be caused by:  Hashimoto's disease. This is a disease in which the body's disease-fighting system (immune system) attacks the thyroid gland. This is the most common cause.  Viral infections.  Pregnancy.  Certain medicines.  Birth defects.  Past radiation treatments to the head or neck for cancer.  Past treatment with radioactive iodine.  Past exposure to radiation in the environment.  Past surgical removal of part or all of the thyroid.  Problems with a gland in the center of the brain (pituitary gland).  Lack of enough iodine in the diet. What increases the risk? You are more likely to develop this condition if:  You are female.  You have a family history of thyroid conditions.  You use a medicine called lithium.  You take medicines that affect the immune system (immunosuppressants). What are the signs or symptoms? Symptoms of this condition include:  Feeling as though you have no energy (lethargy).  Not being able to tolerate cold.  Weight gain that is not explained by a change in diet or exercise habits.  Lack of appetite.  Dry skin.  Coarse hair.  Menstrual irregularity.  Slowing of thought processes.  Constipation.  Sadness or depression. How is this diagnosed? This condition may be diagnosed based on:  Your  symptoms, your medical history, and a physical exam.  Blood tests. You may also have imaging tests, such as an ultrasound or MRI. How is this treated? This condition is treated with medicine that replaces the thyroid hormones that your body does not make. After you begin treatment, it may take several weeks for symptoms to go away. Follow these instructions at home:  Take over-the-counter and prescription medicines only as told by your health care provider.  If you start taking any new medicines, tell your health care provider.  Keep all follow-up visits as told by your health care provider. This is important. ? As your condition improves, your dosage of thyroid hormone medicine may change. ? You will need to have blood tests regularly so that your health care provider can monitor your condition. Contact a health care provider if:  Your symptoms do not get better with treatment.  You are taking thyroid replacement medicine and you: ? Sweat a lot. ? Have tremors. ? Feel anxious. ? Lose weight rapidly. ? Cannot tolerate heat. ? Have emotional swings. ? Have diarrhea. ? Feel weak. Get help right away if you have:  Chest pain.  An irregular heartbeat.  A rapid heartbeat.  Difficulty breathing. Summary  Hypothyroidism is when the thyroid gland does not make enough of certain hormones (it is underactive).  When the thyroid is underactive, it produces too little of the hormones thyroxine (T4) and triiodothyronine (T3).  The most common cause is Hashimoto's disease, a disease in which the body's disease-fighting system (immune system) attacks the thyroid gland. The  condition can also be caused by viral infections, medicine, pregnancy, or past radiation treatment to the head or neck.  Symptoms may include weight gain, dry skin, constipation, feeling as though you do not have energy, and not being able to tolerate cold.  This condition is treated with medicine to replace the  thyroid hormones that your body does not make. This information is not intended to replace advice given to you by your health care provider. Make sure you discuss any questions you have with your health care provider. Document Released: 11/16/2005 Document Revised: 10/29/2017 Document Reviewed: 10/27/2017 Elsevier Patient Education  2020 [employer].

## 2019-11-09 NOTE — Progress Notes (Signed)
Patient ID: Katherine BlightSusan A Young, female   DOB: 08/23/1956, 63 y.o.   MRN: [id number]   This visit occurred during the SARS-CoV-2 public health emergency.  Safety protocols were in place, including screening questions prior to the visit, additional usage of staff PPE, and extensive cleaning of exam room while observing appropriate contact time as indicated for disinfecting solutions.   HPI  Katherine Young is a 63 y.o.-year-old female, referred by her OB/GYN doctor, Dr. Hyacinth Young, for management of newly diagnosed hypothyroidism.  She had Covid19 in 08/2019, developed after having a sick contact at work. No fever, cough, but had HAs, fatigue, and mm aches.  She is now feeling better.  Pt. was found to have a high TSH on 09/05/2019.  At that time, she saw Dr. Loreta Young she was complaining of constipation, fatigue, hair loss.  The tests were repeated by PCP 9 days later and the TSH was still high.  Free T4 was normal.  She is not on levothyroxine yet.  I reviewed pt's thyroid tests: Lab Results  Component Value Date   TSH 7.600 (H) 09/14/2019   FREET4 0.84 09/14/2019  09/05/2019: TSH 8.14  No available antithyroid antibodies: No results found for: THGAB No components found for: TPOAB  Pt describes: - no weight gain, but cannot lose weight despite being active and previously running, before her Kovic 19 diagnosis - + fatigue - no cold intolerance, + heat intolerance - no depression - + constipation - no dry skin, + oily skin - + hair loss  Pt denies feeling nodules in neck, hoarseness, dysphagia/odynophagia, SOB with lying down.  She has no FH of thyroid disorders. No FH of thyroid cancer.  No h/o radiation tx to head or neck. No recent use of iodine supplements. She was on Biotin 2 mo ago >> stopped during her COVID-19 infection, and did not restart afterwards.  She was on Qsymia. She has a h/o gastric lap band Sx in 2012. This did not work for her. She also has a history of controlled  hypertension. She has a lot of stress - her husband has cancer.  ROS: + HPI Constitutional: See HPI, + nocturia Eyes: no blurry vision, no xerophthalmia ENT: no sore throat, + see HPI, + tinnitus Cardiovascular: no CP/SOB/palpitations/leg swelling Respiratory: no cough/SOB Gastrointestinal: no N/V/D/+ C (on Amitiza) Musculoskeletal: no muscle/+ joint aches (knees) Skin: no rashes Neurological: no tremors/numbness/tingling/dizziness Psychiatric: + depression/+ anxiety + low libido  Past Medical History:  Diagnosis Date  . Crushed injury, wrist 08/2004   Right  . Hypertension    Past Surgical History:  Procedure Laterality Date  . ABDOMINOPLASTY  9/13  . ABLATION  10/2003   HTA  . BREAST CYST ASPIRATION Right 04/2004   @ [facility]  . BREAST ENHANCEMENT SURGERY  1983  . BREAST IMPLANT REMOVAL  09/2006  . LAPAROSCOPIC GASTRIC BANDING  11/12  . REDUCTION MAMMAPLASTY Bilateral    implants removed  . TYMPANOPLASTY  12/89; 11/90   Social History   Socioeconomic History  . Marital status: Married    Spouse name: Not on file  . Number of children: 2  . Years of education: Not on file  . Highest education level: Not on file  Occupational History  .  Administrative services at [employer]  Tobacco Use  . Smoking status: Never Smoker  . Smokeless tobacco: Never Used  Substance and Sexual Activity  . Alcohol use: Yes    Alcohol/week: 1.0 - 2.0 standard drinks    Types: 1 -  2 Standard (mixed) drinks occasionally  . Drug use: No  . Sexual activity: Yes    Partners: Male    Birth control/protection: Surgical    Comment: Ablation  Other Topics Concern  . Not on file  Social History Narrative  . Not on file   Social Determinants of Health   Financial Resource Strain:   . Difficulty of Paying Living Expenses: Not on file  Food Insecurity:   . Worried About Charity fundraiser in the Last Year: Not on file  . Ran Out of Food in the Last Year: Not on file  Transportation Needs:   .  Lack of Transportation (Medical): Not on file  . Lack of Transportation (Non-Medical): Not on file  Physical Activity:   . Days of Exercise per Week: Not on file  . Minutes of Exercise per Session: Not on file  Stress:   . Feeling of Stress : Not on file  Social Connections:   . Frequency of Communication with Friends and Family: Not on file  . Frequency of Social Gatherings with Friends and Family: Not on file  . Attends Religious Services: Not on file  . Active Member of Clubs or Organizations: Not on file  . Attends Archivist Meetings: Not on file  . Marital Status: Not on file  Intimate Partner Violence:   . Fear of Current or Ex-Partner: Not on file  . Emotionally Abused: Not on file  . Physically Abused: Not on file  . Sexually Abused: Not on file   Current Outpatient Medications on File Prior to Visit  Medication Sig Dispense Refill  . AMABELZ 0.5-0.1 MG tablet TAKE 1 TABLET DAILY 84 tablet 0  . citalopram (CELEXA) 20 MG tablet Take 20 mg by mouth daily.    Marland Kitchen lubiprostone (AMITIZA) 24 MCG capsule Take 24 mcg by mouth daily with breakfast.     . QSYMIA 7.5-46 MG CP24 Take 1 tablet by mouth daily.    . valsartan-hydrochlorothiazide (DIOVAN-HCT) 160-12.5 MG tablet Take 1 tablet by mouth daily.    Marland Kitchen XIIDRA 5 % SOLN Place 1 drop into both eyes daily.    Marland Kitchen amphetamine-dextroamphetamine (ADDERALL XR) 10 MG 24 hr capsule daily.     No current facility-administered medications on file prior to visit.   No Known Allergies Family History  Problem Relation Age of Onset  . Breast cancer Mother 24       dec 73-had mets to liver from breast ca  . Hypertension Mother   . Heart disease Mother   . Hypertension Father   . Heart disease Father   . Heart attack Father    PE: BP 118/70   Pulse 92   Ht 5' 2.5" (1.588 m)   Wt 212 lb (96.2 kg)   SpO2 95%   BMI 38.16 kg/m  Wt Readings from Last 3 Encounters:  11/09/19 212 lb (96.2 kg)  09/14/19 208 lb (94.3 kg)  05/31/18  193 lb (87.5 kg)   Constitutional: overweight, in NAD Eyes: PERRLA, EOMI, no exophthalmos ENT: moist mucous membranes, no thyromegaly, no cervical lymphadenopathy Cardiovascular: RRR, No MRG Respiratory: CTA B Gastrointestinal: abdomen soft, NT, ND, BS+ Musculoskeletal: no deformities, strength intact in all 4 Skin: moist, warm, no rashes Neurological: no tremor with outstretched hands, DTR normal in all 4  ASSESSMENT: 1. Hypothyroidism - recent diagnosis  PLAN:  1. Patient with newly diagnosed hypothyroidism, not on levothyroxine therapy yet. - she does have complaints which can be related to her  hypothyroidism: Constipation, fatigue, hair loss, inability to lose weight - she does not appear to have a goiter, thyroid nodules, or neck compression symptoms - We discussed about subclinical hypothyroidism in general and also about indications for treatment:  Desire for pregnancy  TSH higher than 10  TSH lower than 10 with signs or symptoms consistent with hypothyroidism - The TSH is close to 10, and, in the presence of symptoms, I would suggest to start a low-dose levothyroxine now, if the repeat set of tests are consistent with prior.   - We discussed about correct intake of levothyroxine, fasting, with water, separated by at least 30 minutes from breakfast, and separated by more than 4 hours from calcium, iron, multivitamins, acid reflux medications (PPIs). - will check thyroid tests today: TSH, free T3, free T4 and also TPO and ATA antibodies to screen for Hashimoto's thyroiditis.  We discussed about this as being an autoimmune disease that causes inflammation in her thyroid and ultimately distraction of the gland.  Treatment is not necessarily different if she ends up having Hashimoto's thyroiditis, but we discussed that she needs to be working on improving her immune system: Improve diet (reduce animal protein), improve exercise, improve relaxation and reduce stress, taking time for  herself.  Selenium is a supplement that can be used to improve the antibody levels.  I would recommend this if her antibodies are very high.  Starting levothyroxine will also help in improving the antibody levels.  - If we end up starting levothyroxine, she will need to return in ~5-6 weeks for repeat labs - Otherwise, I will see her back in 4 months  Component     Latest Ref Rng & Units 11/09/2019  TSH     0.35 - 4.50 uIU/mL 5.81 (H)  T4,Free(Direct)     0.60 - 1.60 ng/dL 6.16  Thyroperoxidase Ab SerPl-aCnc     <9 IU/mL 2  Thyroglobulin Ab     < or = 1 IU/mL <1  Triiodothyronine,Free,Serum     2.3 - 4.2 pg/mL 2.8   TSH remains elevated, slightly improved.  Antithyroid antibodies are not elevated.  At this point, we can start a low-dose levothyroxine to see if she feels better.  If not, we can stop and recheck her TFTs.  Carlus Pavlov, MD PhD [facility] Endocrinology

## 2019-11-10 LAB — THYROID PEROXIDASE ANTIBODY: Thyroperoxidase Ab SerPl-aCnc: 2 IU/mL (ref <9)

## 2019-11-10 LAB — THYROGLOBULIN ANTIBODY: Thyroglobulin Ab: <1 IU/mL (ref <=1)

## 2019-11-10 MED ORDER — LEVOTHYROXINE SODIUM 25 MCG PO TABS: 25.0000 ug | ORAL_TABLET | Freq: Every day | ORAL | 3 refills | Status: DC

## 2019-11-14 ENCOUNTER — Ambulatory Visit: Payer: BC Managed Care – PPO | Admitting: Internal Medicine

## 2019-11-20 ENCOUNTER — Other Ambulatory Visit: Payer: Self-pay

## 2019-11-20 ENCOUNTER — Ambulatory Visit
Admission: RE | Admit: 2019-11-20 | Discharge: 2019-11-20 | Disposition: A | Discharge status: Home / Self Care | Payer: BC Managed Care – PPO | Source: Ambulatory Visit | Attending: Obstetrics & Gynecology | Admitting: Obstetrics & Gynecology

## 2019-11-20 DIAGNOSIS — Z1231 Encounter for screening mammogram for malignant neoplasm of breast: Secondary | ICD-10-CM

## 2020-01-11 ENCOUNTER — Other Ambulatory Visit: Payer: Self-pay | Admitting: Obstetrics & Gynecology

## 2020-01-11 DIAGNOSIS — Z79890 Hormone replacement therapy (postmenopausal): Secondary | ICD-10-CM

## 2020-01-11 DIAGNOSIS — Z01419 Encounter for gynecological examination (general) (routine) without abnormal findings: Secondary | ICD-10-CM

## 2020-01-11 NOTE — Telephone Encounter (Signed)
Med refill request: Katherine Young Last AEX: 09/14/19 Next AEX: 12/13/2020 Last MMG (if hormonal med) 11/20/2019, Birads 1, negative. Refill authorized: # [id number], 4 RF, pended if approved.

## 2020-01-22 ENCOUNTER — Other Ambulatory Visit: Payer: Self-pay

## 2020-01-23 ENCOUNTER — Ambulatory Visit: Payer: BC Managed Care – PPO | Admitting: Obstetrics & Gynecology

## 2020-01-23 ENCOUNTER — Encounter: Payer: Self-pay | Admitting: Obstetrics & Gynecology

## 2020-01-23 VITALS — BP 120/78 | HR 84 | Temp 97.7°F | Resp 12 | Ht 64.0 in

## 2020-01-23 DIAGNOSIS — R3915 Urgency of urination: Secondary | ICD-10-CM | POA: Diagnosis not present

## 2020-01-23 DIAGNOSIS — R21 Rash and other nonspecific skin eruption: Secondary | ICD-10-CM | POA: Diagnosis not present

## 2020-01-23 DIAGNOSIS — N898 Other specified noninflammatory disorders of vagina: Secondary | ICD-10-CM

## 2020-01-23 LAB — POCT URINALYSIS DIPSTICK
Bilirubin, UA: NEGATIVE
Blood, UA: NEGATIVE
Glucose, UA: NEGATIVE
Ketones, UA: NEGATIVE
Leukocytes, UA: NEGATIVE
Nitrite, UA: NEGATIVE
Protein, UA: NEGATIVE
Urobilinogen, UA: 0.2 E.U./dL
pH, UA: 5 (ref 5.0–8.0)

## 2020-01-23 MED ORDER — FLUCONAZOLE 150 MG PO TABS: ORAL_TABLET | ORAL | 0 refills | Status: DC

## 2020-01-23 MED ORDER — TRIAMCINOLONE ACETONIDE 0.5 % EX OINT: 1.0000 "application " | TOPICAL_OINTMENT | Freq: Two times a day (BID) | CUTANEOUS | 0 refills | Status: DC

## 2020-01-23 NOTE — Progress Notes (Signed)
GYNECOLOGY  VISIT  CC:   Patient complains of having "really bad itching" on the labia and near the urethral opening. Occasional burning. Per patient also has an "itchy patch" at the top of her buttocks.  HPI: 64 y.o. G107P2002 Married White or Caucasian female here for itching that started about three weeks ago.  She used some OTC vaginal cream externally.  She used antifungal powder as well.  None of this has helped.  She started having some periurethral itching.  Denies vaginal discharge.  She does think there is a little odor.  She has noted an itchy patch on the upper portion of her buttocks as well.    She is having some skin burning if she sits too long.  Denies dysuria.  Denies hematuria.  Does have hx of UTIs.  Last time she was treated was in January.  Also has some mild urinary urgency.  This doesn't feel like a full blown UTI but doesn't feel normal as well.  Her urethral also feels irritated.   She also has this recurrent itching lesion on her buttocks that does go away and come back.  Is present today.  Hasn't put anything on it.  Is always in the same place.   GYNECOLOGIC HISTORY: No LMP recorded. Patient has had an ablation. Contraception: Ablation Menopausal hormone therapy: none  Patient Active Problem List   Diagnosis Date Noted  . Hypothyroidism (acquired) 11/09/2019  . Ganglion cyst of volar aspect of right wrist 11/16/2017  . Chronic wrist pain, right 04/15/2017  . Closed fracture of right distal radius 04/15/2017  . Acid reflux 05/04/2013  . H/O laparoscopic adjustable gastric banding 10/21/2010    Past Medical History:  Diagnosis Date  . Crushed injury, wrist 08/2004   Right  . Hypertension     Past Surgical History:  Procedure Laterality Date  . ABDOMINOPLASTY  9/13  . ABLATION  10/2003   HTA  . BREAST CYST ASPIRATION Right 04/2004   @ [facility]  . BREAST ENHANCEMENT SURGERY  1983  . BREAST IMPLANT REMOVAL  09/2006  . LAPAROSCOPIC GASTRIC BANDING  11/12  .  REDUCTION MAMMAPLASTY Bilateral    implants removed  . TYMPANOPLASTY  12/89; 11/90    MEDS:   Current Outpatient Medications on File Prior to Visit  Medication Sig Dispense Refill  . AMABELZ 0.5-0.1 MG tablet TAKE 1 TABLET DAILY 84 tablet 4  . amphetamine-dextroamphetamine (ADDERALL XR) 10 MG 24 hr capsule daily.    . citalopram (CELEXA) 20 MG tablet Take 20 mg by mouth daily.    Marland Kitchen levothyroxine (SYNTHROID) 25 MCG tablet Take 1 tablet (25 mcg total) by mouth daily before breakfast. 45 tablet 3  . lubiprostone (AMITIZA) 24 MCG capsule Take 24 mcg by mouth daily with breakfast.     . valsartan-hydrochlorothiazide (DIOVAN-HCT) 160-12.5 MG tablet Take 1 tablet by mouth daily.    Marland Kitchen XIIDRA 5 % SOLN Place 1 drop into both eyes daily.    Marland Kitchen QSYMIA 7.5-46 MG CP24 Take 1 tablet by mouth daily.     No current facility-administered medications on file prior to visit.    ALLERGIES: Patient has no known allergies.  Family History  Problem Relation Age of Onset  . Breast cancer Mother 28       dec 73-had mets to liver from breast ca  . Hypertension Mother   . Heart disease Mother   . Hypertension Father   . Heart disease Father   . Heart attack Father  SH:  Married, non smoker  Review of Systems  Genitourinary: Positive for urgency.       Vaginal itching   All other systems reviewed and are negative.   PHYSICAL EXAMINATION:    BP 120/78 (BP Location: Left Arm, Patient Position: Sitting, Cuff Size: Large)   Pulse 84   Temp 97.7 F (36.5 C) (Temporal)   Resp 12   Ht 5\' 4"  (1.626 m)   BMI 36.39 kg/m     Physical Exam  Constitutional: She is oriented to person, place, and time. She appears well-developed and well-nourished.  Genitourinary:    No labial fusion. There is lesion on the right labia. There is no rash, tenderness or injury on the right labia. There is lesion on the left labia. There is no rash, tenderness or injury on the left labia.    No vaginal discharge,  erythema, tenderness or bleeding.  No erythema, tenderness or bleeding in the vagina.    No foreign body in the vagina.     No signs of injury in the vagina.   Lymphadenopathy:       Right: No inguinal adenopathy present.       Left: No inguinal adenopathy present.  Neurological: She is alert and oriented to person, place, and time.  Skin: Skin is warm and dry.     Psychiatric: She has a normal mood and affect.   Chaperone, Karmen Bongo, RN, was present for exam.  Assessment: Inner thigh erythematous changes c/w yeast Vaginal/vulvar itching Urethral itching/irritation Urinary urgency  Plan: Urine culture pending Affirm pending to assess for yeast Diflucan 150mg  po x 1, repeat in 72 hours x 2 doses Kenalog ointment BID.  Rx to pharmacy. HSV PCR pending

## 2020-01-24 LAB — VAGINITIS/VAGINOSIS, DNA PROBE
Candida Species: POSITIVE — AB
Gardnerella vaginalis: NEGATIVE
Trichomonas vaginosis: NEGATIVE

## 2020-01-25 ENCOUNTER — Other Ambulatory Visit: Payer: Self-pay

## 2020-01-25 LAB — URINE CULTURE

## 2020-01-25 MED ORDER — AMOXICILLIN 500 MG PO CAPS: 500.0000 mg | ORAL_CAPSULE | Freq: Three times a day (TID) | ORAL | 0 refills | Status: AC

## 2020-01-26 ENCOUNTER — Other Ambulatory Visit: Payer: Self-pay | Admitting: Obstetrics & Gynecology

## 2020-01-26 LAB — HSV DNA BY PCR (REFERENCE LAB)
HSV 2 DNA: POSITIVE — AB
HSV-1 DNA: NEGATIVE

## 2020-01-26 MED ORDER — VALACYCLOVIR HCL 500 MG PO TABS: ORAL_TABLET | ORAL | 2 refills | Status: DC

## 2020-01-26 NOTE — Progress Notes (Unsigned)
Valtrex rx sent to pharmacy.

## 2020-01-31 ENCOUNTER — Encounter: Payer: Self-pay | Admitting: Obstetrics & Gynecology

## 2020-02-09 ENCOUNTER — Other Ambulatory Visit: Payer: Self-pay

## 2020-02-12 ENCOUNTER — Other Ambulatory Visit: Payer: Self-pay

## 2020-02-12 ENCOUNTER — Ambulatory Visit: Payer: BC Managed Care – PPO | Admitting: Obstetrics & Gynecology

## 2020-02-12 ENCOUNTER — Encounter: Payer: Self-pay | Admitting: Obstetrics & Gynecology

## 2020-02-12 VITALS — BP 138/76 | HR 72 | Temp 97.9°F | Resp 10 | Ht 64.0 in

## 2020-02-12 DIAGNOSIS — B372 Candidiasis of skin and nail: Secondary | ICD-10-CM

## 2020-02-12 DIAGNOSIS — B009 Herpesviral infection, unspecified: Secondary | ICD-10-CM

## 2020-02-12 DIAGNOSIS — Z87440 Personal history of urinary (tract) infections: Secondary | ICD-10-CM

## 2020-02-12 NOTE — Progress Notes (Signed)
GYNECOLOGY  VISIT  HPI: 64 y.o. G23P2002 Married White or Caucasian female here for 2 week recheck.  It has actually been about three weeks since last visit.  Reports vuvlar itching it much improved.  Has not needed the steroid as much.  Finished Diflucan.  Does need to give repeat urine sample for follow up culture today.  She's not had any new HSV outbreaks but does know how to take the medication.  GYNECOLOGIC HISTORY: No LMP recorded. Patient has had an ablation. Contraception: ablation Menopausal hormone therapy: Amabelz  Patient Active Problem List   Diagnosis Date Noted  . Hypothyroidism (acquired) 11/09/2019  . Ganglion cyst of volar aspect of right wrist 11/16/2017  . Chronic wrist pain, right 04/15/2017  . Closed fracture of right distal radius 04/15/2017  . Acid reflux 05/04/2013  . H/O laparoscopic adjustable gastric banding 10/21/2010    Past Medical History:  Diagnosis Date  . Crushed injury, wrist 08/2004   Right  . 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 Hypertension     Past Surgical History:  Procedure Laterality Date  . ABDOMINOPLASTY  9/13  . ABLATION  10/2003   HTA  . BREAST CYST ASPIRATION Right 04/2004   @ [facility]  . BREAST ENHANCEMENT SURGERY  1983  . BREAST IMPLANT REMOVAL  09/2006  . LAPAROSCOPIC GASTRIC BANDING  11/12  . REDUCTION MAMMAPLASTY Bilateral    implants removed  . TYMPANOPLASTY  12/89; 11/90    MEDS:   Current Outpatient Medications on File Prior to Visit  Medication Sig Dispense Refill  . AMABELZ 0.5-0.1 MG tablet TAKE 1 TABLET DAILY 84 tablet 4  . amphetamine-dextroamphetamine (ADDERALL XR) 10 MG 24 hr capsule daily.    . citalopram (CELEXA) 20 MG tablet Take 20 mg by mouth daily.    Marland Kitchen levothyroxine (SYNTHROID) 25 MCG tablet Take 1 tablet (25 mcg total) by mouth daily before breakfast. 45 tablet 3  . lubiprostone (AMITIZA) 24 MCG capsule Take 24 mcg by mouth daily with breakfast.     . QSYMIA 7.5-46 MG CP24 Take 1 tablet by  mouth daily.    Marland Kitchen triamcinolone ointment (KENALOG) 0.5 % Apply 1 application topically 2 (two) times daily. 30 g 0  . valsartan-hydrochlorothiazide (DIOVAN-HCT) 160-12.5 MG tablet Take 1 tablet by mouth daily.    Marland Kitchen XIIDRA 5 % SOLN Place 1 drop into both eyes daily.     No current facility-administered medications on file prior to visit.    ALLERGIES: Patient has no known allergies.  Family History  Problem Relation Age of Onset  . Breast cancer Mother 69       dec 73-had mets to liver from breast ca  . Hypertension Mother   . Heart disease Mother   . Hypertension Father   . Heart disease Father   . Heart attack Father     SH:  Married, non smoker  Review of Systems  All other systems reviewed and are negative.   PHYSICAL EXAMINATION:    BP 138/76 (BP Location: Left Arm, Patient Position: Sitting, Cuff Size: Normal)   Pulse 72   Temp 97.9 F (36.6 C) (Temporal)   Resp 10   Ht 5\' 4"  (1.626 m)   BMI 36.39 kg/m     General appearance: alert, cooperative and appears stated age Lymph:  no inguinal LAD noted  Pelvic: External genitalia:  Inner thigh lesion is completedly resolved on the left and almost resolved on the right.  Urethra:  normal appearing urethra with no masses, tenderness or lesions              Bartholins and Skenes: normal                 Anus:  No visible skin lesions  Chaperone, Terence Lux, CMA, was present for exam.  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  Reviewed other OTC antifungal powders as well.

## 2020-02-14 LAB — URINE CULTURE

## 2020-02-15 ENCOUNTER — Encounter: Payer: Self-pay | Admitting: Obstetrics & Gynecology

## 2020-02-15 ENCOUNTER — Telehealth: Payer: Self-pay | Admitting: Obstetrics & Gynecology

## 2020-02-15 NOTE — Telephone Encounter (Signed)
Patient sent the following correspondence through MyChart.  I read the result of my urine culture of 3/15.  If I read it currently, it shows bacteria. Do I need additional medication?

## 2020-02-15 NOTE — Telephone Encounter (Signed)
Dr. Hyacinth Meeker -please review 02/12/20 UC results and advise if any additional f/u needed.

## 2020-02-19 NOTE — Telephone Encounter (Signed)
My chart message sent to pt that this is skin and vaginal bacteria and treatment is not needed for it.  Ok to call or ensure pt has read [employer].  Thanks.

## 2020-02-20 NOTE — Telephone Encounter (Signed)
Call to patient, left detailed message, ok per dpr, name identified on voicemail. Advised as seen below per Dr. Hyacinth Meeker. Return call to office if any additional questions/concerns.    Katherine Young, I realized I did not send you a note about your urine culture. It is negative. The "mixed urogenital flora" is typical vaginal and skin bacteria. So, this is negative. Please let me know if you have any questions/concerns. Thanks.  Leda Quail  Written by Jerene Bears, MD on 02/19/2020 12:58 PM EDT   Encounter closed.

## 2020-03-08 ENCOUNTER — Other Ambulatory Visit: Payer: Self-pay

## 2020-03-11 ENCOUNTER — Other Ambulatory Visit: Payer: Self-pay | Admitting: Internal Medicine

## 2020-03-12 ENCOUNTER — Encounter: Payer: Self-pay | Admitting: Internal Medicine

## 2020-03-12 ENCOUNTER — Ambulatory Visit: Payer: BC Managed Care – PPO | Admitting: Internal Medicine

## 2020-03-12 ENCOUNTER — Other Ambulatory Visit: Payer: Self-pay | Admitting: Internal Medicine

## 2020-03-12 ENCOUNTER — Other Ambulatory Visit: Payer: Self-pay

## 2020-03-12 VITALS — BP 110/60 | HR 71 | Ht 64.0 in | Wt 209.0 lb

## 2020-03-12 DIAGNOSIS — E039 Hypothyroidism, unspecified: Secondary | ICD-10-CM

## 2020-03-12 LAB — T4, FREE: Free T4: 0.75 ng/dL (ref 0.60–1.60)

## 2020-03-12 LAB — TSH: TSH: 5.28 u[IU]/mL — ABNORMAL HIGH (ref 0.35–4.50)

## 2020-03-12 NOTE — Progress Notes (Signed)
Patient ID: Katherine Young, female   DOB: 18-May-1956, 64 y.o.   MRN: [id number]   This visit occurred during the SARS-CoV-2 public health emergency.  Safety protocols were in place, including screening questions prior to the visit, additional usage of staff PPE, and extensive cleaning of exam room while observing appropriate contact time as indicated for disinfecting solutions.   HPI  Katherine Young is a 64 y.o.-year-old female, initially referred by her OB/GYN doctor, Dr. Sabra Heck, returning for follow-up for hypothyroidism.  Last visit 4 months ago.  She had COVID-19 in 08/2019 -with headaches, fatigue, and muscle aches.  At last visit she still had some fatigue.  Now feeling better.  Reviewed and addended history: Pt. was found to have a high TSH on 09/05/2019.  At that time, she saw Dr. Collene Mares she was complaining of constipation, fatigue, hair loss.   The tests were repeated by PCP 9 days later and the TSH was still high.  Free T4 was normal.    In 10/2019, we checked a TSH and is still slightly elevated while her free thyroid hormones and thyroid antibodies were normal.  We started levothyroxine low-dose.  Pt is on levothyroxine 25 mcg daily, started in 10/2019. She takes this: - in am - fasting - at least 30 min from b'fast - no Ca, Fe, PPIs - + MVI lunchtime or pm - + Biotin in B complex - last dose 2 days ago! (100 mcg daily)  Reviewed her TFTs: Lab Results  Component Value Date   TSH 5.81 (H) 11/09/2019   TSH 7.600 (H) 09/14/2019   FREET4 0.68 11/09/2019   FREET4 0.84 09/14/2019   T3FREE 2.8 11/09/2019  09/05/2019: TSH 8.14  Reviewed antithyroid antibody levels-not elevated: Component     Latest Ref Rng & Units 11/09/2019  Thyroperoxidase Ab SerPl-aCnc     <9 IU/mL 2  Thyroglobulin Ab     < or = 1 IU/mL <1   At last visit she described difficulty with losing weight despite being active and previously running, before her COVID-19 diagnosis.  She also had fatigue, heat  intolerance, constipation and hair loss.  After starting levothyroxine, she still feels a little tired - she is now back in town after she visited her daughter and her children in [place].  She did lose approximately 30 pounds since last visit.  Pt denies: - feeling nodules in neck - hoarseness - dysphagia - choking - SOB with lying down  No FH of thyroid cancer. No h/o radiation tx to head or neck.  No seaweed or kelp. No recent contrast studies. No herbal supplements. No Biotin use, was on this in the past. No recent steroids use.   She was on Qsymia. She has a h/o gastric lap band Sx in 2012.  However, this did not work for her. She also has controlled HTN. She has a lot of stress - her husband has cancer.  She was recently dx'ed with scalp eczema.  ROS: Constitutional: no weight gain/no weight loss, + mild fatigue, no subjective hyperthermia, no subjective hypothermia Eyes: no blurry vision, no xerophthalmia ENT: no sore throat, + see HPI Cardiovascular: no CP/no SOB/no palpitations/no leg swelling Respiratory: no cough/no SOB/no wheezing Gastrointestinal: no N/no V/no D/+ C (Amitiza)/no acid reflux Musculoskeletal: no muscle aches/+ joint aches (knees) Skin: no rashes, no hair loss Neurological: no tremors/no numbness/no tingling/no dizziness  I reviewed pt's medications, allergies, PMH, social hx, family hx, and changes were documented in the history of present  illness. Otherwise, unchanged from my initial visit note.  Past Medical History:  Diagnosis Date  . Crushed injury, wrist 08/2004   Right  . 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 Hypertension    Past Surgical History:  Procedure Laterality Date  . ABDOMINOPLASTY  9/13  . ABLATION  10/2003   HTA  . BREAST CYST ASPIRATION Right 04/2004   @ [facility]  . BREAST ENHANCEMENT SURGERY  1983  . BREAST IMPLANT REMOVAL  09/2006  . LAPAROSCOPIC GASTRIC BANDING  11/12  . REDUCTION MAMMAPLASTY Bilateral     implants removed  . TYMPANOPLASTY  12/89; 11/90   Social History   Socioeconomic History  . Marital status: Married    Spouse name: Not on file  . Number of children: 2  . Years of education: Not on file  . Highest education level: Not on file  Occupational History  .  Administrative services at [employer]  Tobacco Use  . Smoking status: Never Smoker  . Smokeless tobacco: Never Used  Substance and Sexual Activity  . Alcohol use: Yes    Alcohol/week: 1.0 - 2.0 standard drinks    Types: 1 - 2 Standard (mixed) drinks occasionally  . Drug use: No  . Sexual activity: Yes    Partners: Male    Birth control/protection: Surgical    Comment: Ablation  Other Topics Concern  . Not on file  Social History Narrative  . Not on file   Social Determinants of Health   Financial Resource Strain:   . Difficulty of Paying Living Expenses: Not on file  Food Insecurity:   . Worried About Programme researcher, broadcasting/film/video in the Last Year: Not on file  . Ran Out of Food in the Last Year: Not on file  Transportation Needs:   . Lack of Transportation (Medical): Not on file  . Lack of Transportation (Non-Medical): Not on file  Physical Activity:   . Days of Exercise per Week: Not on file  . Minutes of Exercise per Session: Not on file  Stress:   . Feeling of Stress : Not on file  Social Connections:   . Frequency of Communication with Friends and Family: Not on file  . Frequency of Social Gatherings with Friends and Family: Not on file  . Attends Religious Services: Not on file  . Active Member of Clubs or Organizations: Not on file  . Attends Banker Meetings: Not on file  . Marital Status: Not on file  Intimate Partner Violence:   . Fear of Current or Ex-Partner: Not on file  . Emotionally Abused: Not on file  . Physically Abused: Not on file  . Sexually Abused: Not on file   Current Outpatient Medications on File Prior to Visit  Medication Sig Dispense Refill  . AMABELZ 0.5-0.1 MG  tablet TAKE 1 TABLET DAILY 84 tablet 4  . amphetamine-dextroamphetamine (ADDERALL XR) 10 MG 24 hr capsule daily.    . citalopram (CELEXA) 20 MG tablet Take 20 mg by mouth daily.    Marland Kitchen levothyroxine (SYNTHROID) 25 MCG tablet TAKE 1 TABLET BY MOUTH DAILY BEFORE BREAKFAST. 90 tablet 1  . lubiprostone (AMITIZA) 24 MCG capsule Take 24 mcg by mouth daily with breakfast.     . QSYMIA 7.5-46 MG CP24 Take 1 tablet by mouth daily.    Marland Kitchen triamcinolone ointment (KENALOG) 0.5 % Apply 1 application topically 2 (two) times daily. 30 g 0  . valsartan-hydrochlorothiazide (DIOVAN-HCT) 160-12.5 MG tablet Take 1 tablet  by mouth daily.    Marland Kitchen XIIDRA 5 % SOLN Place 1 drop into both eyes daily.     No current facility-administered medications on file prior to visit.   No Known Allergies Family History  Problem Relation Age of Onset  . Breast cancer Mother 63       dec 73-had mets to liver from breast ca  . Hypertension Mother   . Heart disease Mother   . Hypertension Father   . Heart disease Father   . Heart attack Father    PE: BP 110/60   Pulse 71   Ht 5\' 4"  (1.626 m)   Wt 209 lb (94.8 kg)   SpO2 95%   BMI 35.87 kg/m  Wt Readings from Last 3 Encounters:  03/12/20 209 lb (94.8 kg)  11/09/19 212 lb (96.2 kg)  09/14/19 208 lb (94.3 kg)   Constitutional: overweight, in NAD Eyes: PERRLA, EOMI, no exophthalmos ENT: moist mucous membranes, no thyromegaly, no cervical lymphadenopathy Cardiovascular: RRR, No MRG Respiratory: CTA B Gastrointestinal: abdomen soft, NT, ND, BS+ Musculoskeletal: no deformities, strength intact in all 4 Skin: moist, warm, no rashes Neurological: no tremor with outstretched hands, DTR normal in all 4  ASSESSMENT: 1. Hypothyroidism - recent diagnosis  PLAN:  1. Patient with relatively recent hypothyroidism, not autoimmune per review of labs from last visit.  She presented with possibly hypothyroid complaints: Constipation, fatigue, hair loss, inability to lose weight.  At  last visit, since her TSH was slightly above the normal range, even with normal free thyroid hormones, we started a low-dose levothyroxine to see if her symptoms improved. - latest TSH reviewed with pt: Lab Results  Component Value Date   TSH 5.81 (H) 11/09/2019   - she continues on LT4 25 mcg daily - pt feels good on this dose, but she does not feel much different compared to last visit.  However, now that she is back in town after visiting her daughter for 2 months, she would like to get back into her routine and reevaluate her symptoms while continuing medication.  I explained that, since her hypothyroidism is mild, we can give her another trial off  medication, if she prefers, in the future. - we discussed about taking the thyroid hormone every day, with water, >30 minutes before breakfast, separated by >4 hours from acid reflux medications, calcium, iron, multivitamins. Pt. is taking it correctly. - will check thyroid tests today: TSH and fT4 - If labs are abnormal, she will need to return for repeat TFTs in 1.5 months - OTW, I will see her back in 6 months  Office Visit on 03/12/2020  Component Date Value Ref Range Status  . TSH 03/12/2020 5.28* 0.35 - 4.50 uIU/mL Final  . Free T4 03/12/2020 0.75  0.60 - 1.60 ng/dL Final   Comment: Specimens from patients who are undergoing biotin therapy and /or ingesting biotin supplements may contain high levels of biotin.  The higher biotin concentration in these specimens interferes with this Free T4 assay.  Specimens that contain high levels  of biotin may cause false high results for this Free T4 assay.  Please interpret results in light of the total clinical presentation of the patient.     Tests are only slightly improved.  We will check with the patient if she wants to increase the dose of LT 4 to 50 mcg daily and return for labs in 1.5 months.  03/14/2020, MD PhD [facility]

## 2020-03-12 NOTE — Patient Instructions (Signed)
Please continue Levothyroxine 25 mcg daily.  Take the thyroid hormone every day, with water, at least 30 minutes before breakfast, separated by at least 4 hours from: - acid reflux medications - calcium - iron - multivitamins  Please stop at the lab.  Please come back for a follow-up appointment in 6 months.  

## 2020-03-13 ENCOUNTER — Other Ambulatory Visit: Payer: Self-pay | Admitting: Internal Medicine

## 2020-03-13 ENCOUNTER — Encounter: Payer: Self-pay | Admitting: Internal Medicine

## 2020-03-13 MED ORDER — LEVOTHYROXINE SODIUM 50 MCG PO TABS: ORAL_TABLET | ORAL | 3 refills | Status: DC

## 2020-08-26 ENCOUNTER — Ambulatory Visit: Payer: BC Managed Care – PPO | Admitting: Obstetrics & Gynecology

## 2020-08-26 ENCOUNTER — Telehealth: Payer: Self-pay

## 2020-08-26 ENCOUNTER — Encounter: Payer: Self-pay | Admitting: Obstetrics & Gynecology

## 2020-08-26 ENCOUNTER — Other Ambulatory Visit: Payer: Self-pay

## 2020-08-26 VITALS — BP 104/70 | HR 68 | Resp 16 | Wt 208.0 lb

## 2020-08-26 DIAGNOSIS — N907 Vulvar cyst: Secondary | ICD-10-CM | POA: Diagnosis not present

## 2020-08-26 NOTE — Telephone Encounter (Signed)
Spoke with patient. Patient reports itchy, raised bumps on inner labia and inside opening of vaginal canal. Noticed them last week. No vag d/c, odor, pain or bleeding. No new products or partner changes. Requesting OV.   Last AEX 09/14/19  OV scheduled for today at 1:30pm w/ Dr. Hyacinth Meeker. Patient is agreeable to date and time.   Encounter closed.

## 2020-08-26 NOTE — Telephone Encounter (Signed)
Patient is calling in regards to raised lumps in vaginal area.

## 2020-08-26 NOTE — Progress Notes (Signed)
GYNECOLOGY  VISIT  CC:   Vulvar bumps  HPI: 64 y.o. G89P2002 Married White or Caucasian female here for bumps on labia.  She noticed that about a month ago.  These areas are non-tender.  She was recently learned about a friend who died of vaginal melanoma and pt decided she should be seen.  Denies vaginal bleeding.    GYNECOLOGIC HISTORY: No LMP recorded. Patient has had an ablation. Contraception: post menopausal & ablation Menopausal hormone therapy: none  Patient Active Problem List   Diagnosis Date Noted  . Hypothyroidism (acquired) 11/09/2019  . Ganglion cyst of volar aspect of right wrist 11/16/2017  . Chronic wrist pain, right 04/15/2017  . Closed fracture of right distal radius 04/15/2017  . Acid reflux 05/04/2013  . H/O laparoscopic adjustable gastric banding 10/21/2010    Past Medical History:  Diagnosis Date  . Crushed injury, wrist 08/2004   Right  . 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 Hypertension     Past Surgical History:  Procedure Laterality Date  . ABDOMINOPLASTY  9/13  . ABLATION  10/2003   HTA  . BREAST CYST ASPIRATION Right 04/2004   @ [facility]  . BREAST ENHANCEMENT SURGERY  1983  . BREAST IMPLANT REMOVAL  09/2006  . LAPAROSCOPIC GASTRIC BANDING  11/12  . REDUCTION MAMMAPLASTY Bilateral    implants removed  . TYMPANOPLASTY  12/89; 11/90    MEDS:   Current Outpatient Medications on File Prior to Visit  Medication Sig Dispense Refill  . AMABELZ 0.5-0.1 MG tablet TAKE 1 TABLET DAILY 84 tablet 4  . citalopram (CELEXA) 20 MG tablet Take 20 mg by mouth daily.    Marland Kitchen ketoconazole (NIZORAL) 2 % shampoo Apply 1 application topically 2 (two) times a week.    . levothyroxine (SYNTHROID) 25 MCG tablet Take 25 mcg by mouth every morning.    . lubiprostone (AMITIZA) 24 MCG capsule Take 24 mcg by mouth daily with breakfast.     . valsartan-hydrochlorothiazide (DIOVAN-HCT) 160-12.5 MG tablet Take 1 tablet by mouth daily.    Marland Kitchen XIIDRA 5 % SOLN Place 1 drop  into both eyes daily.     No current facility-administered medications on file prior to visit.    ALLERGIES: Patient has no known allergies.  Family History  Problem Relation Age of Onset  . Breast cancer Mother 55       dec 73-had mets to liver from breast ca  . Hypertension Mother   . Heart disease Mother   . Hypertension Father   . Heart disease Father   . Heart attack Father     SH:  Married, non smoker  Review of Systems  Constitutional: Negative.   HENT: Negative.   Eyes: Negative.   Respiratory: Negative.   Cardiovascular: Negative.   Gastrointestinal: Negative.   Endocrine: Negative.   Genitourinary: Negative.   Musculoskeletal: Negative.   Skin:       Bumps on labia  Allergic/Immunologic: Negative.   Neurological: Negative.   Hematological: Negative.   Psychiatric/Behavioral: Negative.     PHYSICAL EXAMINATION:    Wt 208 lb (94.3 kg)   BMI 35.70 kg/m     General appearance: alert, cooperative and appears stated age Lymph:  no inguinal LAD noted  Pelvic: External genitalia:  Several tiny sebaceous cysts noted on vulva, pointed out to pt and she confirms this is what she is feeling.  These are all 1-24mm in size  Urethra:  normal appearing urethra with no masses, tenderness or lesions              Bartholins and Skenes: normal                  Chaperone, Zenovia Jordan, CMA, was present for exam.  Assessment: Vulvar sebaceous cysts  Plan: Pt reassured and no treatment is recommended.  D/w pt trying to leave these alone if possible unless having issues with abscess around cyst but due to size, this is unlikely.

## 2020-08-29 DIAGNOSIS — N907 Vulvar cyst: Secondary | ICD-10-CM | POA: Insufficient documentation

## 2020-09-02 ENCOUNTER — Other Ambulatory Visit: Payer: Self-pay | Admitting: Internal Medicine

## 2020-09-11 ENCOUNTER — Ambulatory Visit: Payer: BC Managed Care – PPO | Admitting: Internal Medicine

## 2020-09-20 ENCOUNTER — Telehealth: Payer: Self-pay

## 2020-09-20 DIAGNOSIS — N907 Vulvar cyst: Secondary | ICD-10-CM

## 2020-09-20 NOTE — Telephone Encounter (Signed)
Reviewed with Dr. Hyacinth Meeker, ok to proceed with removal of sebaceous cyst.  Spoke with patient, advised as seen above, patient will be traveling on 10/28, requst to schedule after she returns.   Procedure scheduled for 11/4 at 3pm w/ Dr. Hyacinth Meeker.  Order placed for precert.  Patient verbalizes understanding and is agreeable.   Routing to provider for final review. Patient is agreeable to disposition. Will close encounter.  Cc: Hayley Carder

## 2020-09-20 NOTE — Telephone Encounter (Signed)
Spoke with patient. Patient was seen in office on 9/27 for vulvar sebaceous cysts. Patient is requesting cyst to be removed. Patient states she feels like one has increased in size and she is noticing increased irritation with movement. Denies any new symptoms. No drainage, fever/chills or pain.   Advised per review of last OV notes, removal was not recommended at that time. Advised I will review with Dr. Hyacinth Meeker and f/u with recommendations. Patient agreeable.   Dr. Hyacinth Meeker -please review and advise.

## 2020-09-20 NOTE — Telephone Encounter (Signed)
Patient is calling in regards to "having a cyst on vaginal area" and stated she would like it removed.

## 2020-09-23 ENCOUNTER — Telehealth: Payer: Self-pay

## 2020-09-23 NOTE — Telephone Encounter (Signed)
Spoke with patient regarding benefits for scheduled Sebaceous cyst removal . Patient acknowledges understanding of information presented. Encounter closed.

## 2020-10-01 ENCOUNTER — Encounter: Payer: Self-pay | Admitting: Internal Medicine

## 2020-10-01 ENCOUNTER — Other Ambulatory Visit: Payer: Self-pay

## 2020-10-01 ENCOUNTER — Ambulatory Visit: Payer: BC Managed Care – PPO | Admitting: Internal Medicine

## 2020-10-01 VITALS — BP 128/82 | HR 78 | Ht 64.0 in | Wt 208.6 lb

## 2020-10-01 DIAGNOSIS — E039 Hypothyroidism, unspecified: Secondary | ICD-10-CM

## 2020-10-01 NOTE — Patient Instructions (Signed)
Please continue Levothyroxine 25 mcg daily.  Take the thyroid hormone every day, with water, at least 30 minutes before breakfast, separated by at least 4 hours from: - acid reflux medications - calcium - iron - multivitamins  Please stop at the lab.  Please come back for a follow-up appointment in 6 months.

## 2020-10-01 NOTE — Progress Notes (Signed)
Patient ID: Katherine Young, female   DOB: October 12, 1956, 64 y.o.   MRN: [id number]   This visit occurred during the SARS-CoV-2 public health emergency.  Safety protocols were in place, including screening questions prior to the visit, additional usage of staff PPE, and extensive cleaning of exam room while observing appropriate contact time as indicated for disinfecting solutions.   HPI  Katherine Young is a 64 y.o.-year-old female, initially referred by her OB/GYN doctor, Dr. Hyacinth Meeker, returning for follow-up for hypothyroidism.  Last visit 6 months ago.  Reviewed and addended history: Pt. was found to have a high TSH on 09/05/2019.  At that time, she saw Dr. Loreta Ave she was complaining of constipation, fatigue, hair loss.   The tests were repeated by PCP 9 days later and the TSH was still high.  Free T4 was normal.    In 10/2019, we checked a TSH and is still slightly elevated while her free thyroid hormones and thyroid antibodies were normal.  We started levothyroxine 25 mcg daily.  In 02/2020, I advised her to increase the dose to 50 mcg daily.  She did not do so yet...  Pt is still on levothyroxine 25 mcg daily, taken: - in am - fasting - at least 30 min from b'fast - no calcium - no iron - + multivitamins >4h later - no PPIs - stopped B complex as hair loss improved  Reviewed her TFTs: Lab Results  Component Value Date   TSH 5.28 (H) 03/12/2020   TSH 5.81 (H) 11/09/2019   TSH 7.600 (H) 09/14/2019   FREET4 0.75 03/12/2020   FREET4 0.68 11/09/2019   FREET4 0.84 09/14/2019   T3FREE 2.8 11/09/2019  09/05/2019: TSH 8.14  She did not have elevated antithyroid antibodies Component     Latest Ref Rng & Units 11/09/2019  Thyroperoxidase Ab SerPl-aCnc     <9 IU/mL 2  Thyroglobulin Ab     < or = 1 IU/mL <1   At the end of last year, she described difficulty with losing weight despite being active and previously running, before her COVID-19 diagnosis.  She also had fatigue, heat intolerance,  constipation and hair loss.  After starting levothyroxine, symptoms improved just a little, however, before last visit, she lost approximately 30 pounds.  At this visit, weight is stable.  Her hair loss is better.  Pt denies: - feeling nodules in neck - hoarseness - dysphagia - choking - SOB with lying down  No FH of thyroid cancer. No h/o radiation tx to head or neck.  No seaweed or kelp. No recent contrast studies. No herbal supplements. No Biotin use. No recent steroids use.   She was on Qsymia. She has a h/o gastric lap band Sx in 2012.  However, this did not work for her. She also has controlled HTN. She also has scalp eczema. She has a lot of stress -husband with cancer.  ROS: Constitutional: no weight gain/no weight loss, + mild fatigue, no subjective hyperthermia, no subjective hypothermia Eyes: no blurry vision, no xerophthalmia ENT: no sore throat, + see HPI Cardiovascular: no CP/no SOB/no palpitations/no leg swelling Respiratory: no cough/no SOB/no wheezing Gastrointestinal: no N/no V/no D/+ C (on Amitiza)/no acid reflux Musculoskeletal: no muscle aches/+ joint aches - knees - better with walking Skin: no rashes, no hair loss Neurological: no tremors/no numbness/no tingling/no dizziness  I reviewed pt's medications, allergies, PMH, social hx, family hx, and changes were documented in the history of present illness. Otherwise, unchanged from my initial  visit note.  Past Medical History:  Diagnosis Date  . Crushed injury, wrist 08/2004   Right  . 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 Hypertension    Past Surgical History:  Procedure Laterality Date  . ABDOMINOPLASTY  9/13  . ABLATION  10/2003   HTA  . BREAST CYST ASPIRATION Right 04/2004   @ [facility]  . BREAST ENHANCEMENT SURGERY  1983  . BREAST IMPLANT REMOVAL  09/2006  . LAPAROSCOPIC GASTRIC BANDING  11/12  . REDUCTION MAMMAPLASTY Bilateral    implants removed  . TYMPANOPLASTY  12/89; 11/90   Social  History   Socioeconomic History  . Marital status: Married    Spouse name: Not on file  . Number of children: 2  . Years of education: Not on file  . Highest education level: Not on file  Occupational History  .  Administrative services at [employer]  Tobacco Use  . Smoking status: Never Smoker  . Smokeless tobacco: Never Used  Substance and Sexual Activity  . Alcohol use: Yes    Alcohol/week: 1.0 - 2.0 standard drinks    Types: 1 - 2 Standard (mixed) drinks occasionally  . Drug use: No  . Sexual activity: Yes    Partners: Male    Birth control/protection: Surgical    Comment: Ablation  Other Topics Concern  . Not on file  Social History Narrative  . Not on file   Social Determinants of Health   Financial Resource Strain:   . Difficulty of Paying Living Expenses: Not on file  Food Insecurity:   . Worried About Programme researcher, broadcasting/film/video in the Last Year: Not on file  . Ran Out of Food in the Last Year: Not on file  Transportation Needs:   . Lack of Transportation (Medical): Not on file  . Lack of Transportation (Non-Medical): Not on file  Physical Activity:   . Days of Exercise per Week: Not on file  . Minutes of Exercise per Session: Not on file  Stress:   . Feeling of Stress : Not on file  Social Connections:   . Frequency of Communication with Friends and Family: Not on file  . Frequency of Social Gatherings with Friends and Family: Not on file  . Attends Religious Services: Not on file  . Active Member of Clubs or Organizations: Not on file  . Attends Banker Meetings: Not on file  . Marital Status: Not on file  Intimate Partner Violence:   . Fear of Current or Ex-Partner: Not on file  . Emotionally Abused: Not on file  . Physically Abused: Not on file  . Sexually Abused: Not on file   Current Outpatient Medications on File Prior to Visit  Medication Sig Dispense Refill  . AMABELZ 0.5-0.1 MG tablet TAKE 1 TABLET DAILY 84 tablet 4  . citalopram (CELEXA) 20  MG tablet Take 20 mg by mouth daily.    Marland Kitchen ketoconazole (NIZORAL) 2 % shampoo Apply 1 application topically 2 (two) times a week.    . levothyroxine (SYNTHROID) 25 MCG tablet TAKE 1 TABLET BY MOUTH EVERY DAY BEFORE BREAKFAST 90 tablet 1  . lubiprostone (AMITIZA) 24 MCG capsule Take 24 mcg by mouth daily with breakfast.     . valsartan-hydrochlorothiazide (DIOVAN-HCT) 160-12.5 MG tablet Take 1 tablet by mouth daily.    Marland Kitchen XIIDRA 5 % SOLN Place 1 drop into both eyes daily.     No current facility-administered medications on file prior to visit.  No Known Allergies Family History  Problem Relation Age of Onset  . Breast cancer Mother 27       dec 73-had mets to liver from breast ca  . Hypertension Mother   . Heart disease Mother   . Hypertension Father   . Heart disease Father   . Heart attack Father    PE: BP 128/82   Pulse 78   Ht 5\' 4"  (1.626 m)   Wt 208 lb 9.6 oz (94.6 kg)   SpO2 96%   BMI 35.81 kg/m  Wt Readings from Last 3 Encounters:  10/01/20 208 lb 9.6 oz (94.6 kg)  08/26/20 208 lb (94.3 kg)  03/12/20 209 lb (94.8 kg)   Constitutional: overweight, in NAD Eyes: PERRLA, EOMI, no exophthalmos ENT: moist mucous membranes, no thyromegaly, no cervical lymphadenopathy Cardiovascular: RRR, No MRG Respiratory: CTA B Gastrointestinal: abdomen soft, NT, ND, BS+ Musculoskeletal: no deformities, strength intact in all 4 Skin: moist, warm, no rashes Neurological: no tremor with outstretched hands, DTR normal in all 4  ASSESSMENT: 1. Hypothyroidism - recent diagnosis  PLAN:  1. Patient with mild hypothyroidism autoimmune per review of her thyroid antibody levels.  She presented with possible hypothyroid complaints: Constipation, fatigue, hair loss, inability to lose weight.  TSH was slightly above the upper limit of normal.  We started a low-dose levothyroxine and advised her to increase the dose to 50 mcg daily at last visit (she tells me that she actually did not increase the  dose, but remains on 25 mcg).  She was not sure whether she wanted to continue on the medicine or stop since she did not feel much different after starting levothyroxine. However, she is now taking the medication consistently. - latest thyroid labs reviewed with pt >> slightly high: Lab Results  Component Value Date   TSH 5.28 (H) 03/12/2020   - she continues on LT4 25 mcg daily - pt feels good on this dose. - we discussed about taking the thyroid hormone every day, with water, >30 minutes before breakfast, separated by >4 hours from acid reflux medications, calcium, iron, multivitamins. Pt. is taking it correctly. - she had labs with PCP this morning.  She is not sure whether a TSH was drawn.  She will check on this but return for labs here if a TSH was not drawn. - If labs are abnormal, she will need to return for repeat TFTs in 1.5 months - OTW, I will see her back in 6 months  03/14/2020, MD PhD [facility]

## 2020-10-10 ENCOUNTER — Telehealth: Payer: Self-pay

## 2020-10-10 ENCOUNTER — Ambulatory Visit: Payer: Self-pay | Admitting: Obstetrics & Gynecology

## 2020-10-10 NOTE — Telephone Encounter (Signed)
Patient was scheduled for sebaceous cyst removal with Dr Hyacinth Meeker and would like to reschedule appointment.

## 2020-10-10 NOTE — Telephone Encounter (Signed)
Left message to call Dagon Budai, CMA. °

## 2020-10-14 NOTE — Telephone Encounter (Signed)
Spoke with patient and she has decided to wait and see Dr.Miller at her new practice. She thanked me for calling her back.

## 2020-10-15 ENCOUNTER — Encounter: Payer: Self-pay | Admitting: Internal Medicine

## 2020-11-05 ENCOUNTER — Encounter: Payer: Self-pay | Admitting: *Deleted

## 2020-11-07 ENCOUNTER — Other Ambulatory Visit: Payer: Self-pay | Admitting: Obstetrics & Gynecology

## 2020-11-07 DIAGNOSIS — Z1231 Encounter for screening mammogram for malignant neoplasm of breast: Secondary | ICD-10-CM

## 2020-11-14 ENCOUNTER — Other Ambulatory Visit: Payer: Self-pay | Admitting: Obstetrics & Gynecology

## 2020-11-14 ENCOUNTER — Other Ambulatory Visit: Payer: Self-pay

## 2020-11-14 ENCOUNTER — Ambulatory Visit: Payer: BC Managed Care – PPO | Admitting: Obstetrics & Gynecology

## 2020-11-14 ENCOUNTER — Encounter: Payer: Self-pay | Admitting: Obstetrics & Gynecology

## 2020-11-14 VITALS — BP 124/89 | HR 71 | Ht 64.0 in | Wt 211.0 lb

## 2020-11-14 DIAGNOSIS — N9089 Other specified noninflammatory disorders of vulva and perineum: Secondary | ICD-10-CM | POA: Diagnosis not present

## 2020-11-14 DIAGNOSIS — B373 Candidiasis of vulva and vagina: Secondary | ICD-10-CM

## 2020-11-14 DIAGNOSIS — B3731 Acute candidiasis of vulva and vagina: Secondary | ICD-10-CM

## 2020-11-14 MED ORDER — TERCONAZOLE 0.4 % VA CREA: TOPICAL_CREAM | VAGINAL | 0 refills | Status: DC

## 2020-11-14 MED ORDER — FLUCONAZOLE 150 MG PO TABS: ORAL_TABLET | ORAL | 0 refills | Status: DC

## 2020-11-14 NOTE — Progress Notes (Signed)
GYNECOLOGY  VISIT  CC:   Removal of vulvar lesion  HPI: 64 y.o. G21P2002 Married White or Caucasian female here for possible removal of vulvar sebaceous cyst.  This was seen earlier this fall and was not large.  Recommended monitoring.  Pt continues to feel area and just wants it removed.  Here for this today.  GYNECOLOGIC HISTORY: No LMP recorded. Patient has had an ablation. Contraception: PMP Menopausal hormone therapy: none  Patient Active Problem List   Diagnosis Date Noted  . Sebaceous cyst of labia 08/29/2020  . Hypothyroidism (acquired) 11/09/2019  . Ganglion cyst of volar aspect of right wrist 11/16/2017  . Chronic wrist pain, right 04/15/2017  . Closed fracture of right distal radius 04/15/2017  . Acid reflux 05/04/2013  . H/O laparoscopic adjustable gastric banding 10/21/2010    Past Medical History:  Diagnosis Date  . Crushed injury, wrist 08/2004   Right  . 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 Hypertension     Past Surgical History:  Procedure Laterality Date  . ABDOMINOPLASTY  9/13  . ABLATION  10/2003   HTA  . BREAST CYST ASPIRATION Right 04/2004   @ [facility]  . BREAST ENHANCEMENT SURGERY  1983  . BREAST IMPLANT REMOVAL  09/2006  . LAPAROSCOPIC GASTRIC BANDING  11/12  . REDUCTION MAMMAPLASTY Bilateral    implants removed  . TYMPANOPLASTY  12/89; 11/90    MEDS:   Current Outpatient Medications on File Prior to Visit  Medication Sig Dispense Refill  . AMABELZ 0.5-0.1 MG tablet TAKE 1 TABLET DAILY 84 tablet 4  . citalopram (CELEXA) 20 MG tablet Take 20 mg by mouth daily.    Marland Kitchen ketoconazole (NIZORAL) 2 % shampoo Apply 1 application topically 2 (two) times a week.    . levothyroxine (SYNTHROID) 25 MCG tablet TAKE 1 TABLET BY MOUTH EVERY DAY BEFORE BREAKFAST 90 tablet 1  . lubiprostone (AMITIZA) 24 MCG capsule Take 24 mcg by mouth daily with breakfast.     . valsartan-hydrochlorothiazide (DIOVAN-HCT) 160-12.5 MG tablet Take 1 tablet by mouth daily.     Marland Kitchen XIIDRA 5 % SOLN Place 1 drop into both eyes daily.     No current facility-administered medications on file prior to visit.    ALLERGIES: Patient has no known allergies.  Family History  Problem Relation Age of Onset  . Breast cancer Mother 74       dec 73-had mets to liver from breast ca  . Hypertension Mother   . Heart disease Mother   . Hypertension Father   . Heart disease Father   . Heart attack Father     SH:  Married, non smoker  Review of Systems  All other systems reviewed and are negative.   PHYSICAL EXAMINATION:    BP 124/89   Pulse 71   Ht 5\' 4"  (1.626 m)   Wt 211 lb (95.7 kg)   BMI 36.22 kg/m     Physical Exam Constitutional:      Appearance: Normal appearance.  Abdominal:     Hernia: There is no hernia in the left inguinal area or right inguinal area.  Genitourinary:    Labia:        Right: Lesion present.     Lymphadenopathy:     Lower Body: No right inguinal adenopathy. No left inguinal adenopathy.  Neurological:     General: No focal deficit present.     Mental Status: She is alert.  Psychiatric:  Mood and Affect: Mood normal.    Area of skin that is raised and rough is what pt is feeling.  She does desire removal.  Consent obtained.  Time out performed.  Procedure:  Area cleansed with Betadine.  Sterile technique used throughout procedure.  Skin anesthestized with Lidocaine 1% plain; 1.77mL.  Lesion elevated and excised fully with sterile scissors and sterile pick-ups.  Silver nitrate used for excellent hemostasis.  Pt tolerated procedure well.    Chaperone, Mariel Aloe, RN, was present for exam.  Assessment/Plan: 1. Vulvar lesion, excised today - Surgical pathology( [place]/ [facility])  2. Vulvovaginitis due to yeast - fluconazole (DIFLUCAN) 150 MG tablet; take 1 tab and repeat in 72 hours  Dispense: 1 tablet; Refill: 0 - terconazole (TERAZOL 7) 0.4 % vaginal cream; Apply topically BID for 7 days.  Dispense: 45 g; Refill:  0 .

## 2020-11-15 DIAGNOSIS — K5904 Chronic idiopathic constipation: Secondary | ICD-10-CM | POA: Insufficient documentation

## 2020-11-26 ENCOUNTER — Other Ambulatory Visit: Payer: Self-pay

## 2020-11-26 ENCOUNTER — Ambulatory Visit
Admission: RE | Admit: 2020-11-26 | Discharge: 2020-11-26 | Disposition: A | Discharge status: Home / Self Care | Payer: BC Managed Care – PPO | Source: Ambulatory Visit

## 2020-11-26 DIAGNOSIS — Z1231 Encounter for screening mammogram for malignant neoplasm of breast: Secondary | ICD-10-CM

## 2020-12-02 ENCOUNTER — Other Ambulatory Visit: Payer: Self-pay | Admitting: Obstetrics & Gynecology

## 2020-12-02 ENCOUNTER — Other Ambulatory Visit: Payer: Self-pay

## 2020-12-02 DIAGNOSIS — B373 Candidiasis of vulva and vagina: Secondary | ICD-10-CM

## 2020-12-02 DIAGNOSIS — B3731 Acute candidiasis of vulva and vagina: Secondary | ICD-10-CM

## 2020-12-02 MED ORDER — NYSTATIN 100000 UNIT/GM EX CREA: 1.0000 "application " | TOPICAL_CREAM | Freq: Two times a day (BID) | CUTANEOUS | 0 refills | Status: DC

## 2020-12-03 ENCOUNTER — Other Ambulatory Visit: Payer: Self-pay

## 2020-12-03 ENCOUNTER — Other Ambulatory Visit (INDEPENDENT_AMBULATORY_CARE_PROVIDER_SITE_OTHER): Payer: BC Managed Care – PPO

## 2020-12-03 DIAGNOSIS — E039 Hypothyroidism, unspecified: Secondary | ICD-10-CM | POA: Diagnosis not present

## 2020-12-03 LAB — T4, FREE: Free T4: 0.67 ng/dL (ref 0.60–1.60)

## 2020-12-03 LAB — TSH: TSH: 4.31 u[IU]/mL (ref 0.35–4.50)

## 2020-12-04 ENCOUNTER — Other Ambulatory Visit: Payer: Self-pay | Admitting: Internal Medicine

## 2020-12-04 ENCOUNTER — Other Ambulatory Visit: Payer: Self-pay

## 2020-12-04 MED ORDER — LEVOTHYROXINE SODIUM 50 MCG PO TABS: ORAL_TABLET | ORAL | 3 refills | Status: DC

## 2020-12-13 ENCOUNTER — Ambulatory Visit: Payer: BC Managed Care – PPO | Admitting: Obstetrics & Gynecology

## 2020-12-18 ENCOUNTER — Other Ambulatory Visit (HOSPITAL_COMMUNITY)
Admission: RE | Admit: 2020-12-18 | Discharge: 2020-12-18 | Disposition: A | Discharge status: Home / Self Care | Payer: BC Managed Care – PPO | Source: Ambulatory Visit | Attending: Obstetrics & Gynecology | Admitting: Obstetrics & Gynecology

## 2020-12-18 ENCOUNTER — Other Ambulatory Visit: Payer: Self-pay

## 2020-12-18 ENCOUNTER — Ambulatory Visit: Payer: BC Managed Care – PPO | Admitting: Obstetrics & Gynecology

## 2020-12-18 ENCOUNTER — Encounter: Payer: Self-pay | Admitting: Obstetrics & Gynecology

## 2020-12-18 VITALS — BP 107/77 | HR 78 | Ht 64.0 in | Wt 209.0 lb

## 2020-12-18 DIAGNOSIS — L723 Sebaceous cyst: Secondary | ICD-10-CM

## 2020-12-18 DIAGNOSIS — L292 Pruritus vulvae: Secondary | ICD-10-CM

## 2020-12-18 NOTE — Patient Instructions (Signed)
Vulva Biopsy  A vulva biopsy is a procedure in which a sample of tissue from the vulva is removed and examined under a microscope. The vulva is the outside part of the female genitals. The vulva includes the outside folds of skin (labia majora), the inner lips (labia minora), the clitoris, and the openings of the urethra and vagina. Your health care provider may order this procedure to diagnose a lesion, growth, rash, blister, or other tissue changes. This procedure may also be done to remove a mole or wart. Tell a health care provider about:  Any allergies you have.  All medicines you are taking, including vitamins, herbs, eye drops, creams, and over-the-counter medicines.  Any problems you or family members have had with anesthetic medicines.  Any blood disorders you have.  Any surgeries you have had.  Any medical conditions you have.  Whether you are pregnant or may be pregnant. What are the risks? Generally, this is a safe procedure. However, problems may occur, including:  Infection.  Bleeding.  Allergic reaction to medicines.  Damage to other nearby structures or organs.  Pain at the biopsy site. What happens before the procedure? Medicines Ask your health care provider about:  Changing or stopping your regular medicines. This is especially important if you are taking diabetes medicines or blood thinners.  Taking medicines such as aspirin and ibuprofen. These medicines can thin your blood. Do not take these medicines unless your health care provider tells you to take them.  Taking over-the-counter medicines, vitamins, herbs, and supplements. General instructions  Wear loose and comfortable clothing and underwear for the procedure.  Follow instructions from your health care provider about eating or drinking restrictions.  Ask your health care provider what steps will be taken to help prevent infection. These may include: ? Removing hair at the surgery  site. ? Washing skin with a germ-killing soap. ? Taking antibiotic medicine. What happens during the procedure?  You will be given a medicine to numb the area (local anesthetic).  A small sample of tissue will be removed (excised). This tissue will be sent to a lab for testing.  A medicine may be put on the biopsy site to help stop the bleeding.  The biopsy site may be closed with stitches (sutures). The procedure may vary among health care providers and hospitals. What happens after the procedure?  You may be given pain medicine.  You may be given antibiotic ointment.  It is up to you to get the results of your procedure. Ask your health care provider, or the department that is doing the procedure, when your results will be ready. Summary  A vulva biopsy is a procedure in which a sample of tissue from the vulva is removed and examined under a microscope.  Your health care provider may order this procedure to diagnose a lesion, growth, rash, blister, or other tissue changes.  Generally, this is a safe procedure. However, problems may occur, including infection, bleeding, or allergic reaction to medicines.  Ask your health care provider, or the department that is doing the procedure, when your results will be ready. This information is not intended to replace advice given to you by your health care provider. Make sure you discuss any questions you have with your health care provider. Document Revised: 05/19/2018 Document Reviewed: 05/19/2018 Elsevier Patient Education  2021 Elsevier Inc.  

## 2020-12-18 NOTE — Progress Notes (Signed)
GYNECOLOGY  VISIT  CC:   Vulvar itching  HPI: 65 y.o. G23P2002 Married White or Caucasian female here for vulvar biopsy due to continued vulvar itching.  She was initially treated with oral antifugnal and topical antifungal as the vulvar skin changes seem most consistent with yeast.  Pt thinks the itching is mildly better but not significantly.  She also continues to have some small tears in the skin that are very bothersome.  She does have two small sebaceous cysts she'd like removed as well due to feeling itching at these areas as well.  They are small and I've recommended leaving alone but she really wants them removed.  GYNECOLOGIC HISTORY: No LMP recorded. Patient has had an ablation. Contraception: PMP Menopausal hormone therapy: none  Patient Active Problem List   Diagnosis Date Noted  . Chronic idiopathic constipation 11/15/2020  . Hypothyroidism (acquired) 11/09/2019  . Ganglion cyst of volar aspect of right wrist 11/16/2017  . Chronic wrist pain, right 04/15/2017  . Closed fracture of right distal radius 04/15/2017  . Acid reflux 05/04/2013  . H/O laparoscopic adjustable gastric banding 10/21/2010    Past Medical History:  Diagnosis Date  . Crushed injury, wrist 08/2004   Right  . 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 Hypertension     Past Surgical History:  Procedure Laterality Date  . ABDOMINOPLASTY  9/13  . ABLATION  10/2003   HTA  . BREAST CYST ASPIRATION Right 04/2004   @ [facility]  . BREAST ENHANCEMENT SURGERY  1983  . BREAST IMPLANT REMOVAL  09/2006  . LAPAROSCOPIC GASTRIC BANDING  11/12  . REDUCTION MAMMAPLASTY Bilateral    implants removed  . TYMPANOPLASTY  12/89; 11/90    MEDS:   Current Outpatient Medications on File Prior to Visit  Medication Sig Dispense Refill  . AMABELZ 0.5-0.1 MG tablet TAKE 1 TABLET DAILY 84 tablet 4  . citalopram (CELEXA) 20 MG tablet Take 20 mg by mouth daily.    . fluconazole (DIFLUCAN) 150 MG tablet take 1 tab and  repeat in 72 hours 1 tablet 0  . levothyroxine (SYNTHROID) 50 MCG tablet TAKE 1 TABLET BY MOUTH EVERY DAY BEFORE BREAKFAST 90 tablet 3  . lubiprostone (AMITIZA) 24 MCG capsule Take 24 mcg by mouth daily with breakfast.     . nystatin cream (MYCOSTATIN) Apply 1 application topically 2 (two) times daily. Apply to affected area BID for up to 7 days. 30 g 0  . terconazole (TERAZOL 7) 0.4 % vaginal cream Apply topically BID for 7 years. 45 g 0  . valsartan-hydrochlorothiazide (DIOVAN-HCT) 160-12.5 MG tablet Take 1 tablet by mouth daily.    Marland Kitchen ketoconazole (NIZORAL) 2 % shampoo Apply 1 application topically 2 (two) times a week.    Marland Kitchen XIIDRA 5 % SOLN Place 1 drop into both eyes daily.     No current facility-administered medications on file prior to visit.    ALLERGIES: Patient has no known allergies.  Family History  Problem Relation Age of Onset  . Breast cancer Mother 90       dec 73-had mets to liver from breast ca  . Hypertension Mother   . Heart disease Mother   . Hypertension Father   . Heart disease Father   . Heart attack Father     SH:  Married, non smoker  Review of Systems  All other systems reviewed and are negative.   PHYSICAL EXAMINATION:    BP 107/77   Pulse 78  Ht 5\' 4"  (1.626 m)   Wt 209 lb (94.8 kg)   BMI 35.87 kg/m     Physical Exam Exam conducted with a chaperone present.  Constitutional:      Appearance: Normal appearance.  Genitourinary:   Neurological:     General: No focal deficit present.     Mental Status: She is alert.  Psychiatric:        Mood and Affect: Mood normal.    Consent obtained.  Risks/benefits reviewed.  Time out performed.   Procedure:  Area cleansed with Betadine.  Sterile technique used throughout procedure.  Skin anesthestized with marcaine 0.5%, 1.5cc total.  Where two small sebaceous cysts are located, skin was nicked with #11 blade.  Cysts came out intact including including cyst wall. 44mm punch biopsy used to obtain  specimen.  Biopsy grasped with pick-ups and excised with scissors.  Adequate hemostasis obtained with silver nitrate sticks.  Dressing was not applied.  Pt tolerated procedure well.  Chaperone, 11m, RN, was present for exam.  Assessment/Plan: 1. Vulvar itching - biopsy obtained today - Surgical pathology( [place]/ [facility])  2. Sebaceous cyst - s/p removal of two small sebaceous cysts.  Post procedure care discussed with pt.

## 2020-12-18 NOTE — Progress Notes (Signed)
Patient states that the area that was biopsied has not fully healed. Armandina Stammer RN

## 2020-12-23 ENCOUNTER — Other Ambulatory Visit: Payer: Self-pay

## 2020-12-23 LAB — SURGICAL PATHOLOGY

## 2020-12-24 ENCOUNTER — Other Ambulatory Visit: Payer: Self-pay | Admitting: Obstetrics & Gynecology

## 2020-12-24 MED ORDER — TRIAMCINOLONE ACETONIDE 0.5 % EX OINT: TOPICAL_OINTMENT | CUTANEOUS | 1 refills | Status: DC

## 2021-01-25 ENCOUNTER — Other Ambulatory Visit: Payer: Self-pay | Admitting: Obstetrics & Gynecology

## 2021-01-25 DIAGNOSIS — Z01419 Encounter for gynecological examination (general) (routine) without abnormal findings: Secondary | ICD-10-CM

## 2021-01-25 DIAGNOSIS — Z79890 Hormone replacement therapy (postmenopausal): Secondary | ICD-10-CM

## 2021-02-17 ENCOUNTER — Encounter (HOSPITAL_BASED_OUTPATIENT_CLINIC_OR_DEPARTMENT_OTHER): Payer: Self-pay

## 2021-02-18 ENCOUNTER — Other Ambulatory Visit (HOSPITAL_BASED_OUTPATIENT_CLINIC_OR_DEPARTMENT_OTHER): Payer: Self-pay | Admitting: Obstetrics & Gynecology

## 2021-02-18 MED ORDER — VALACYCLOVIR HCL 500 MG PO TABS: 500.0000 mg | ORAL_TABLET | Freq: Two times a day (BID) | ORAL | 1 refills | Status: DC

## 2021-03-02 ENCOUNTER — Other Ambulatory Visit: Payer: Self-pay | Admitting: Internal Medicine

## 2021-04-03 ENCOUNTER — Ambulatory Visit: Payer: BC Managed Care – PPO | Admitting: Internal Medicine

## 2021-04-12 ENCOUNTER — Other Ambulatory Visit: Payer: Self-pay | Admitting: Internal Medicine

## 2021-04-14 ENCOUNTER — Other Ambulatory Visit: Payer: Self-pay

## 2021-04-14 DIAGNOSIS — Z01419 Encounter for gynecological examination (general) (routine) without abnormal findings: Secondary | ICD-10-CM

## 2021-04-14 DIAGNOSIS — Z79890 Hormone replacement therapy (postmenopausal): Secondary | ICD-10-CM

## 2021-04-14 NOTE — Telephone Encounter (Signed)
AEX scheduled with Dr. Hyacinth Meeker for 06/06/21.

## 2021-04-16 ENCOUNTER — Ambulatory Visit: Payer: BC Managed Care – PPO | Admitting: Internal Medicine

## 2021-04-17 ENCOUNTER — Other Ambulatory Visit (HOSPITAL_BASED_OUTPATIENT_CLINIC_OR_DEPARTMENT_OTHER): Payer: Self-pay | Admitting: Obstetrics & Gynecology

## 2021-04-17 DIAGNOSIS — Z79890 Hormone replacement therapy (postmenopausal): Secondary | ICD-10-CM

## 2021-04-17 DIAGNOSIS — Z01419 Encounter for gynecological examination (general) (routine) without abnormal findings: Secondary | ICD-10-CM

## 2021-04-17 MED ORDER — ESTRADIOL-NORETHINDRONE ACET 0.5-0.1 MG PO TABS: 1.0000 | ORAL_TABLET | Freq: Every day | ORAL | 1 refills | Status: DC

## 2021-04-30 ENCOUNTER — Encounter: Payer: Self-pay | Admitting: Internal Medicine

## 2021-04-30 ENCOUNTER — Other Ambulatory Visit: Payer: Self-pay

## 2021-04-30 ENCOUNTER — Ambulatory Visit: Payer: BC Managed Care – PPO | Admitting: Internal Medicine

## 2021-04-30 VITALS — BP 138/88 | HR 68 | Ht 64.0 in | Wt 209.8 lb

## 2021-04-30 DIAGNOSIS — E039 Hypothyroidism, unspecified: Secondary | ICD-10-CM

## 2021-04-30 LAB — T4, FREE: Free T4: 0.75 ng/dL (ref 0.60–1.60)

## 2021-04-30 LAB — TSH: TSH: 5.69 u[IU]/mL — ABNORMAL HIGH (ref 0.35–4.50)

## 2021-04-30 NOTE — Patient Instructions (Addendum)
Please continue Levothyroxine 25 mcg daily.  Take the thyroid hormone every day, with water, at least 30 minutes before breakfast, separated by at least 4 hours from: - acid reflux medications - calcium - iron - multivitamins  Please stop at the lab.  Please come back for a follow-up appointment in 1 year.  

## 2021-04-30 NOTE — Progress Notes (Signed)
Patient ID: Katherine Young, female   DOB: 12/29/1955, 65 y.o.   MRN: [id number]   This visit occurred during the SARS-CoV-2 public health emergency.  Safety protocols were in place, including screening questions prior to the visit, additional usage of staff PPE, and extensive cleaning of exam room while observing appropriate contact time as indicated for disinfecting solutions.   HPI  Katherine Young is a 65 y.o.-year-old female, initially referred by her OB/GYN doctor, Dr. Hyacinth Meeker, returning for follow-up for hypothyroidism.  Last visit 6 months ago.  Interim history: She has been feeling well since last visit, without complaints other than increased hair loss. She just returned from the beach.  Reviewed and addended history: Pt. was found to have a high TSH on 09/05/2019.  At that time, she saw Dr. Loreta Ave she was complaining of constipation, fatigue, hair loss.   The tests were repeated by PCP 9 days later and the TSH was still high.  Free T4 was normal.    In 10/2019, we checked a TSH and is still slightly elevated while her free thyroid hormones and thyroid antibodies were normal.  We started levothyroxine 25 mcg daily.  In 02/2020, I advised her to increase the dose to 50 mcg daily.  She did not do so at that time.  In 11/2020, I advised her to we increase the dose to 50 mcg daily.  She again did not increase the dose as she forgot.  Pt is still on levothyroxine 25 mcg daily, taken: - in am - fasting - at least 30 min from b'fast - no calcium - no iron - Prev. multivitamins 1h later >> now off - no PPIs - stopped B complex as hair loss improved >> now worse  Reviewed her TFTs: Lab Results  Component Value Date   TSH 4.31 12/03/2020   TSH 5.28 (H) 03/12/2020   TSH 5.81 (H) 11/09/2019   TSH 7.600 (H) 09/14/2019   FREET4 0.67 12/03/2020   FREET4 0.75 03/12/2020   FREET4 0.68 11/09/2019   FREET4 0.84 09/14/2019   T3FREE 2.8 11/09/2019  09/05/2019: TSH 8.14  She did not have elevated  antithyroid antibodies Component     Latest Ref Rng & Units 11/09/2019  Thyroperoxidase Ab SerPl-aCnc     <9 IU/mL 2  Thyroglobulin Ab     < or = 1 IU/mL <1   At the end of 2020, she described difficulty with losing weight despite being active and previously running, before her COVID-19 diagnosis.  She also had fatigue, heat intolerance, constipation and hair loss.  After starting levothyroxine, symptoms improved just a little, however, before our visit from 02/2020, she lost approximately 30 pounds.  Weight was stable at last visit.  Hair loss improved.  Pt denies: - feeling nodules in neck - hoarseness - dysphagia - choking - SOB with lying down  No FH of thyroid cancer. No h/o radiation tx to head or neck.  No seaweed or kelp. No recent contrast studies. No herbal supplements. No Biotin use. No recent steroids use.   She was on Qsymia. She has a h/o gastric lap band Sx in 2012.  However, this did not work for her. She also has controlled HTN. She also has scalp eczema. She has a lot of stress -husband with cancer.  ROS: Constitutional: no weight gain/no weight loss, + mild fatigue, no subjective hyperthermia, no subjective hypothermia Eyes: no blurry vision, no xerophthalmia ENT: no sore throat, + see HPI Cardiovascular: no CP/no SOB/no palpitations/no  leg swelling Respiratory: no cough/no SOB/no wheezing Gastrointestinal: no N/no V/no D/+ C (on Amitiza)/no acid reflux Musculoskeletal: no muscle aches/+ joint aches - knees - better with walking Skin: no rashes, no hair loss Neurological: no tremors/no numbness/no tingling/no dizziness  I reviewed pt's medications, allergies, PMH, social hx, family hx, and changes were documented in the history of present illness. Otherwise, unchanged from my initial visit note.  Past Medical History:  Diagnosis Date  . Crushed injury, wrist 08/2004   Right  . 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 Hypertension    Past Surgical  History:  Procedure Laterality Date  . ABDOMINOPLASTY  9/13  . ABLATION  10/2003   HTA  . BREAST CYST ASPIRATION Right 04/2004   @ [facility]  . BREAST ENHANCEMENT SURGERY  1983  . BREAST IMPLANT REMOVAL  09/2006  . LAPAROSCOPIC GASTRIC BANDING  11/12  . REDUCTION MAMMAPLASTY Bilateral    implants removed  . TYMPANOPLASTY  12/89; 11/90   Social History   Socioeconomic History  . Marital status: Married    Spouse name: Not on file  . Number of children: 2  . Years of education: Not on file  . Highest education level: Not on file  Occupational History  .  Administrative services at [employer]  Tobacco Use  . Smoking status: Never Smoker  . Smokeless tobacco: Never Used  Substance and Sexual Activity  . Alcohol use: Yes    Alcohol/week: 1.0 - 2.0 standard drinks    Types: 1 - 2 Standard (mixed) drinks occasionally  . Drug use: No  . Sexual activity: Yes    Partners: Male    Birth control/protection: Surgical    Comment: Ablation  Other Topics Concern  . Not on file  Social History Narrative  . Not on file   Social Determinants of Health   Financial Resource Strain:   . Difficulty of Paying Living Expenses: Not on file  Food Insecurity:   . Worried About Programme researcher, broadcasting/film/video in the Last Year: Not on file  . Ran Out of Food in the Last Year: Not on file  Transportation Needs:   . Lack of Transportation (Medical): Not on file  . Lack of Transportation (Non-Medical): Not on file  Physical Activity:   . Days of Exercise per Week: Not on file  . Minutes of Exercise per Session: Not on file  Stress:   . Feeling of Stress : Not on file  Social Connections:   . Frequency of Communication with Friends and Family: Not on file  . Frequency of Social Gatherings with Friends and Family: Not on file  . Attends Religious Services: Not on file  . Active Member of Clubs or Organizations: Not on file  . Attends Banker Meetings: Not on file  . Marital Status: Not on file   Intimate Partner Violence:   . Fear of Current or Ex-Partner: Not on file  . Emotionally Abused: Not on file  . Physically Abused: Not on file  . Sexually Abused: Not on file   Current Outpatient Medications on File Prior to Visit  Medication Sig Dispense Refill  . citalopram (CELEXA) 20 MG tablet Take 20 mg by mouth daily.    . Estradiol-Norethindrone Acet 0.5-0.1 MG tablet Take 1 tablet by mouth daily. 84 tablet 1  . fluconazole (DIFLUCAN) 150 MG tablet take 1 tab and repeat in 72 hours 1 tablet 0  . levothyroxine (SYNTHROID) 25 MCG tablet TAKE 1  TABLET BY MOUTH EVERY DAY BEFORE BREAKFAST 30 tablet 0  . levothyroxine (SYNTHROID) 50 MCG tablet TAKE 1 TABLET BY MOUTH EVERY DAY BEFORE BREAKFAST 90 tablet 3  . lubiprostone (AMITIZA) 24 MCG capsule Take 24 mcg by mouth daily with breakfast.     . triamcinolone ointment (KENALOG) 0.5 % Apply thin layer to skin once a day.  When itching stops, stop using the steroid. 30 g 1  . valACYclovir (VALTREX) 500 MG tablet Take 1 tablet (500 mg total) by mouth 2 (two) times daily. Take one tablet BID at onset of symptoms for 3 days. 6 tablet 1  . valsartan-hydrochlorothiazide (DIOVAN-HCT) 160-12.5 MG tablet Take 1 tablet by mouth daily.     No current facility-administered medications on file prior to visit.   No Known Allergies Family History  Problem Relation Age of Onset  . Breast cancer Mother 5254       dec 73-had mets to liver from breast ca  . Hypertension Mother   . Heart disease Mother   . Hypertension Father   . Heart disease Father   . Heart attack Father    PE: BP 138/88 (BP Location: Left Arm, Patient Position: Sitting, Cuff Size: Normal)   Pulse 68   Ht 5\' 4"  (1.626 m)   Wt 209 lb 12.8 oz (95.2 kg)   SpO2 95%   BMI 36.01 kg/m  Wt Readings from Last 3 Encounters:  04/30/21 209 lb 12.8 oz (95.2 kg)  12/18/20 209 lb (94.8 kg)  11/14/20 211 lb (95.7 kg)   Constitutional: overweight, in NAD Eyes: PERRLA, EOMI, no  exophthalmos ENT: moist mucous membranes, no thyromegaly, no cervical lymphadenopathy Cardiovascular: RRR, No MRG Respiratory: CTA B Gastrointestinal: abdomen soft, NT, ND, BS+ Musculoskeletal: no deformities, strength intact in all 4 Skin: moist, warm, no rashes Neurological: no tremor with outstretched hands, DTR normal in all 4  ASSESSMENT: 1.  Hashimoto's hypothyroidism  PLAN:  1. Patient with mild acquired hypothyroidism with normal thyroid antibody levels.  This was diagnosed in 2020 when she presented with constipation, fatigue, hair loss, inability to lose weight.  The TSH was slightly above the upper limit of normal.  We started a low-dose levothyroxine, 25 mcg daily.  She did not feel much differently afterwards, however, she opted to continue the medication.  We ended up increasing the dose in 11/2020. - latest thyroid labs reviewed with pt. >> normal TSH, but close to the upper limit of normal: Lab Results  Component Value Date   TSH 4.31 12/03/2020  - she continues on LT4 25 mcg daily - pt feels good on this dose.  She does have hair loss we discussed that this could be due to hypothyroidism.  She is planning to start back on B complex.  We discussed about holding this in preparation for labs, depending on the dose. - we discussed about taking the thyroid hormone every day, with water, >30 minutes before breakfast, separated by >4 hours from acid reflux medications, calcium, iron, multivitamins. Pt. is taking it correctly. - will check thyroid tests today: TSH and fT4 - If labs are abnormal, she will need to return for repeat TFTs in 1.5 months  -I will see her back in a year but possibly sooner for labs  Component     Latest Ref Rng & Units 04/30/2021  TSH     0.35 - 4.50 uIU/mL 5.69 (H)  T4,Free(Direct)     0.60 - 1.60 ng/dL 1.610.75  TSH is slightly high.  I would again suggest to increase the levothyroxine dose to 50 mcg daily.  I will send a prescription for this to her  pharmacy and we will repeat the test in 1.5 months.  Carlus Pavlov, MD PhD [facility] Endocrin[facility]

## 2021-05-01 MED ORDER — LEVOTHYROXINE SODIUM 50 MCG PO TABS: ORAL_TABLET | ORAL | 3 refills | Status: DC

## 2021-05-06 ENCOUNTER — Ambulatory Visit (HOSPITAL_BASED_OUTPATIENT_CLINIC_OR_DEPARTMENT_OTHER): Payer: BC Managed Care – PPO | Admitting: Obstetrics & Gynecology

## 2021-05-10 ENCOUNTER — Other Ambulatory Visit: Payer: Self-pay | Admitting: Internal Medicine

## 2021-05-10 DIAGNOSIS — E039 Hypothyroidism, unspecified: Secondary | ICD-10-CM

## 2021-06-06 ENCOUNTER — Ambulatory Visit (HOSPITAL_BASED_OUTPATIENT_CLINIC_OR_DEPARTMENT_OTHER): Payer: BC Managed Care – PPO | Admitting: Obstetrics & Gynecology

## 2021-06-12 ENCOUNTER — Other Ambulatory Visit: Payer: Self-pay | Admitting: Obstetrics & Gynecology

## 2021-06-12 ENCOUNTER — Other Ambulatory Visit: Payer: Self-pay

## 2021-06-23 ENCOUNTER — Ambulatory Visit (INDEPENDENT_AMBULATORY_CARE_PROVIDER_SITE_OTHER): Payer: BC Managed Care – PPO | Admitting: Obstetrics & Gynecology

## 2021-06-23 ENCOUNTER — Other Ambulatory Visit: Payer: Self-pay

## 2021-06-23 ENCOUNTER — Encounter (HOSPITAL_BASED_OUTPATIENT_CLINIC_OR_DEPARTMENT_OTHER): Payer: Self-pay | Admitting: Obstetrics & Gynecology

## 2021-06-23 ENCOUNTER — Other Ambulatory Visit (HOSPITAL_COMMUNITY)
Admission: RE | Admit: 2021-06-23 | Discharge: 2021-06-23 | Disposition: A | Discharge status: Home / Self Care | Payer: BC Managed Care – PPO | Source: Ambulatory Visit | Attending: Obstetrics & Gynecology | Admitting: Obstetrics & Gynecology

## 2021-06-23 VITALS — BP 117/81 | HR 74 | Ht 63.0 in | Wt 210.6 lb

## 2021-06-23 DIAGNOSIS — Z124 Encounter for screening for malignant neoplasm of cervix: Secondary | ICD-10-CM | POA: Insufficient documentation

## 2021-06-23 DIAGNOSIS — Z803 Family history of malignant neoplasm of breast: Secondary | ICD-10-CM

## 2021-06-23 DIAGNOSIS — Z79890 Hormone replacement therapy (postmenopausal): Secondary | ICD-10-CM | POA: Diagnosis not present

## 2021-06-23 DIAGNOSIS — E2839 Other primary ovarian failure: Secondary | ICD-10-CM | POA: Diagnosis not present

## 2021-06-23 DIAGNOSIS — Z01419 Encounter for gynecological examination (general) (routine) without abnormal findings: Secondary | ICD-10-CM

## 2021-06-23 DIAGNOSIS — L309 Dermatitis, unspecified: Secondary | ICD-10-CM

## 2021-06-23 DIAGNOSIS — A609 Anogenital herpesviral infection, unspecified: Secondary | ICD-10-CM

## 2021-06-23 DIAGNOSIS — E039 Hypothyroidism, unspecified: Secondary | ICD-10-CM

## 2021-06-23 DIAGNOSIS — L292 Pruritus vulvae: Secondary | ICD-10-CM

## 2021-06-23 MED ORDER — ESTRADIOL-NORETHINDRONE ACET 0.5-0.1 MG PO TABS: 1.0000 | ORAL_TABLET | Freq: Every day | ORAL | 3 refills | Status: DC

## 2021-06-23 MED ORDER — VALACYCLOVIR HCL 500 MG PO TABS: ORAL_TABLET | ORAL | 3 refills | Status: DC

## 2021-06-23 NOTE — Progress Notes (Signed)
65 y.o. G22P2002 Married White or Caucasian female here for annual exam.  Denies vaginal bleeding.  Continues to have vulvar itching but much improved.  Bx showed dermatitis.  No LMP recorded. Patient has had an ablation.          Sexually active: Yes.    The current method of family planning is post menopausal status.    Exercising: No.   Smoker:  no  Health Maintenance: Pap:  03/16/2017 Negative History of abnormal Pap:  no MMG:  11/26/2020 Negative Colonoscopy:  10/13/2019, follow up 10 years.  Dr. Loreta Ave BMD:   order placed TDaP:  2018 Shingrix:   completed discussed Hep C testing: 2019 Screening Labs: does with Dr. Ouida Sills.  Should be October   reports that she has never smoked. She has never used smokeless tobacco. She reports current alcohol use of about 1.0 - 2.0 standard drink of alcohol per week. She reports that she does not use drugs.  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     fluconazole (DIFLUCAN) 150 MG tablet take 1 tab and repeat in 72 hours 1 tablet 0   levothyroxine (SYNTHROID) 50 MCG tablet TAKE 1 TABLET BY MOUTH EVERY DAY BEFORE BREAKFAST 90 tablet 3   levothyroxine (SYNTHROID) 50 MCG tablet Take 1 tablet (50 mcg total) by mouth daily before breakfast. 90 tablet 2   lubiprostone (AMITIZA) 24 MCG capsule Take 24 mcg by mouth daily with breakfast.      triamcinolone ointment (KENALOG) 0.5 %  Apply thin layer to skin once a day.  When itching stops, stop using the steroid. 30 g 1   valsartan-hydrochlorothiazide (DIOVAN-HCT) 160-12.5 MG tablet Take 1 tablet by mouth daily.     Estradiol-Norethindrone Acet 0.5-0.1 MG tablet Take 1 tablet by mouth daily. 84 tablet 3   valACYclovir (VALTREX) 500 MG tablet WITH ONSET OF SYMPTOMS, TAKE ONE TABLET TWICE DAILY X 3 DAYS 30 tablet 3   No current facility-administered medications for this visit.    Family History  Problem Relation Age of Onset   Breast cancer Mother 45       dec 73-had mets to liver from breast ca   Hypertension Mother    Heart disease Mother    Hypertension Father    Heart disease Father    Heart attack Father     Review of Systems  All other systems reviewed and are negative.  Exam:   BP 117/81 (BP Location: Left Arm, Patient Position: Sitting, Cuff Size: Large)   Pulse 74   Ht 5\' 3"  (1.6 m)   Wt 210 lb 9.6 oz (95.5 kg)   BMI 37.31 kg/m   Height: 5\' 3"  (160 cm)  General appearance: alert, cooperative and appears stated age Head: Normocephalic, without obvious abnormality, atraumatic Neck: no adenopathy, supple, symmetrical, trachea  midline and thyroid normal to inspection and palpation Lungs: clear to auscultation bilaterally Breasts: normal appearance, no masses or tenderness Heart: regular rate and rhythm Abdomen: soft, non-tender; bowel sounds normal; no masses,  no organomegaly Extremities: extremities normal, atraumatic, no cyanosis or edema Skin: Skin color, texture, turgor normal. No rashes or lesions Lymph nodes: Cervical, supraclavicular, and axillary nodes normal. No abnormal inguinal nodes palpated Neurologic: Grossly normal   Pelvic: External genitalia:  no lesions              Urethra:  normal appearing urethra with no masses, tenderness or lesions              Bartholins and Skenes: normal                 Vagina: normal appearing vagina with normal color and no discharge, no lesions               Cervix: no lesions              Pap taken: Yes.   Bimanual Exam:  Uterus:  normal size, contour, position, consistency, mobility, non-tender              Adnexa: normal adnexa and no mass, fullness, tenderness               Rectovaginal: Confirms               Anus:  normal sphincter tone, no lesions  Chaperone, Ina Homes, CMA, was present for exam.  Assessment/Plan: 1. Well woman exam with routine gynecological exam - pap smear done 02/2017.  Obtained today. - MMG 11/26/2020 - Colonoscopy 10/2019, follow up 10 years - BMD order placed - vaccines reviewed - lab work done with Dr Ouida Sills.  Should have appt in October  2. Hormone replacement therapy - risks and benefits reviewed  - Estradiol-Norethindrone Acet 0.5-0.1 MG tablet; Take 1 tablet by mouth daily.  Dispense: 84 tablet; Refill: 3  3. Family history of breast cancer - mother, dx age 64 and died age 49 - Tyrer Cusick model calculation 14.6% lifetime risk  4. Hypoestrogenemia - DG BONE DENSITY (DXA); Future  5. HSV (herpes simplex virus) anogenital infection - valACYclovir (VALTREX) 500 MG tablet; WITH ONSET OF SYMPTOMS, TAKE ONE TABLET TWICE DAILY X 3 DAYS  Dispense: 30 tablet; Refill: 3  6. Vulvar itching - bx 12/18/2020 showing spongiotic dermatitis - uses triamcinolone ointment prn

## 2021-06-26 DIAGNOSIS — Z79890 Hormone replacement therapy (postmenopausal): Secondary | ICD-10-CM | POA: Insufficient documentation

## 2021-06-26 DIAGNOSIS — Z803 Family history of malignant neoplasm of breast: Secondary | ICD-10-CM | POA: Insufficient documentation

## 2021-06-26 DIAGNOSIS — L309 Dermatitis, unspecified: Secondary | ICD-10-CM | POA: Insufficient documentation

## 2021-06-26 DIAGNOSIS — A609 Anogenital herpesviral infection, unspecified: Secondary | ICD-10-CM | POA: Insufficient documentation

## 2021-06-27 LAB — CYTOLOGY - PAP
Comment: NEGATIVE
Diagnosis: NEGATIVE
High risk HPV: NEGATIVE

## 2021-08-18 ENCOUNTER — Other Ambulatory Visit: Payer: Self-pay | Admitting: Obstetrics & Gynecology

## 2021-08-18 DIAGNOSIS — Z1231 Encounter for screening mammogram for malignant neoplasm of breast: Secondary | ICD-10-CM

## 2021-08-21 IMAGING — MG DIGITAL SCREENING BILAT W/ TOMO W/ CAD
8 series · 8 of 24 positions shown · non-contrast
Comparison: Previous exam(s).

CLINICAL DATA: Screening.

EXAM:
DIGITAL SCREENING BILATERAL MAMMOGRAM WITH TOMO AND CAD

[R MLO synth-2D]
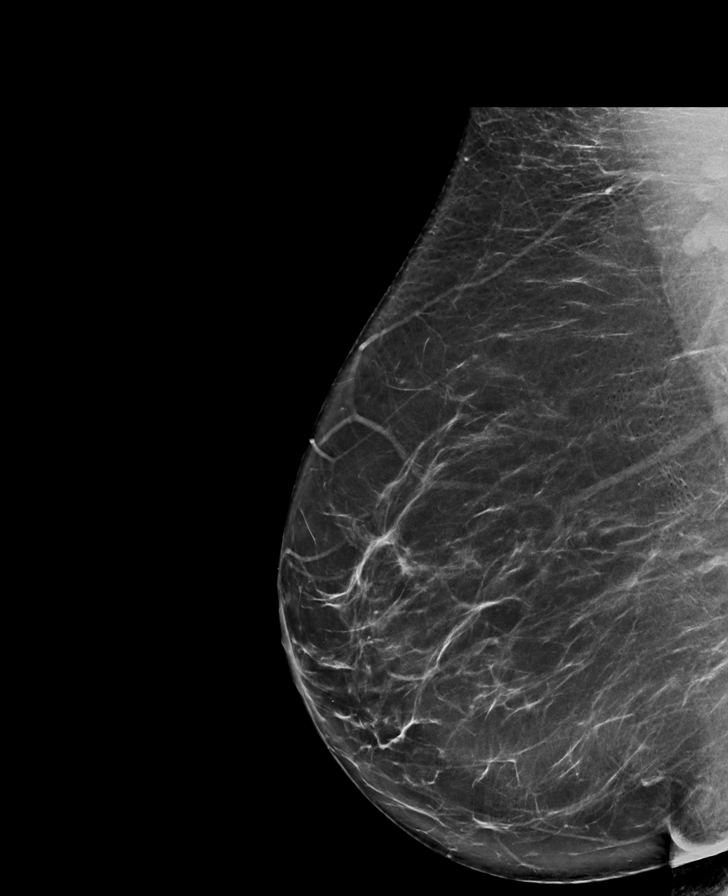

[R CC synth-2D]
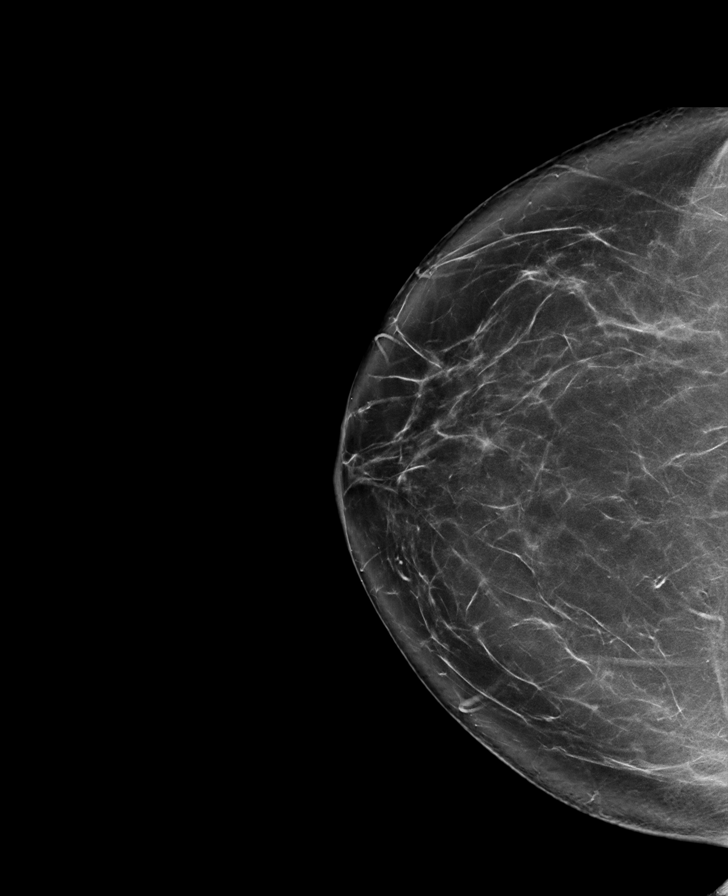

[L MLO synth-2D]
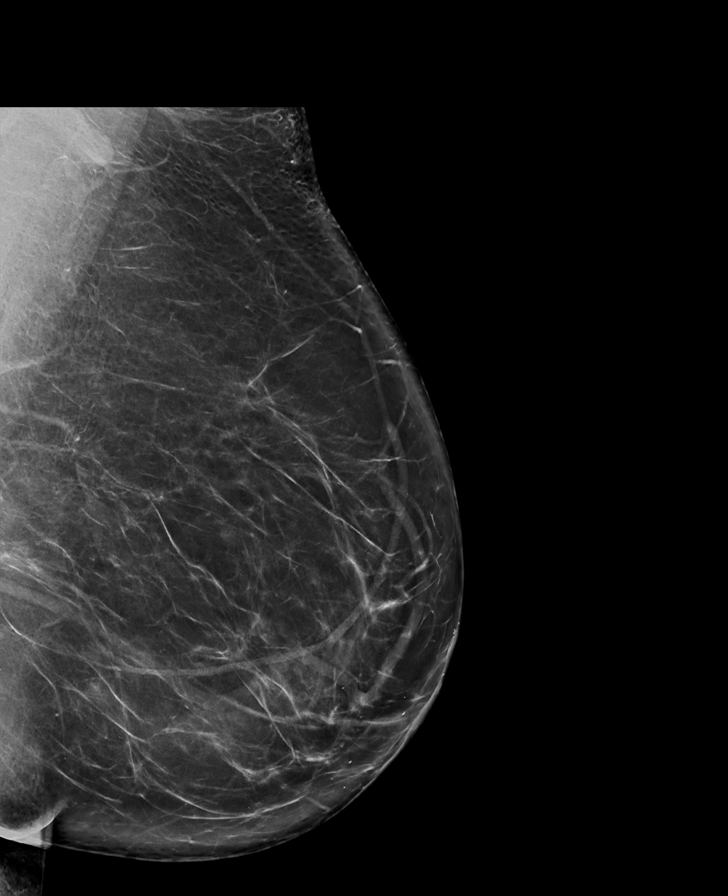

[L CC synth-2D]
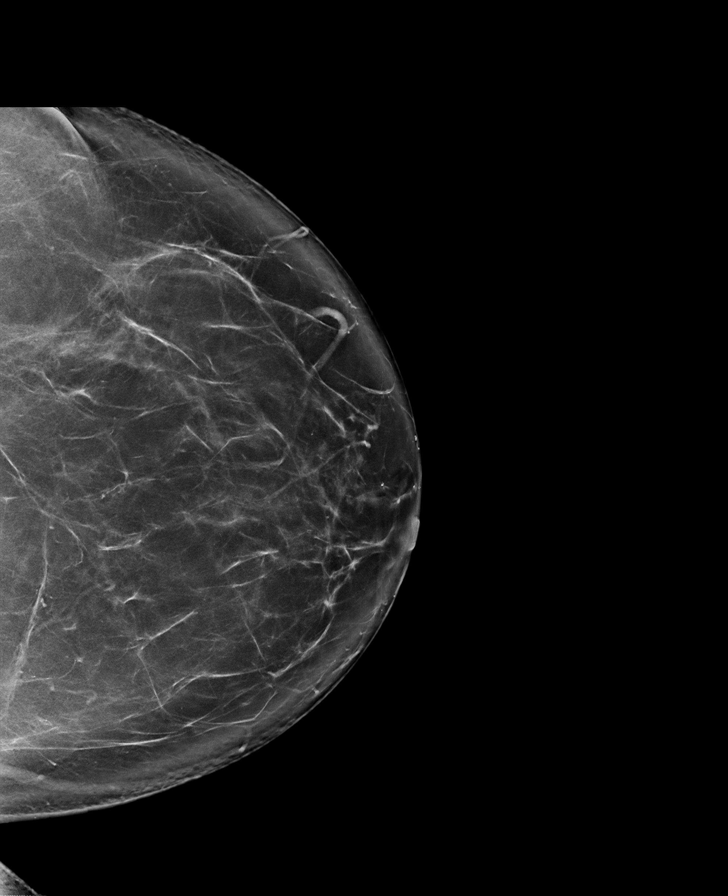

[R CC tomo · tomo slice 47/92.0]
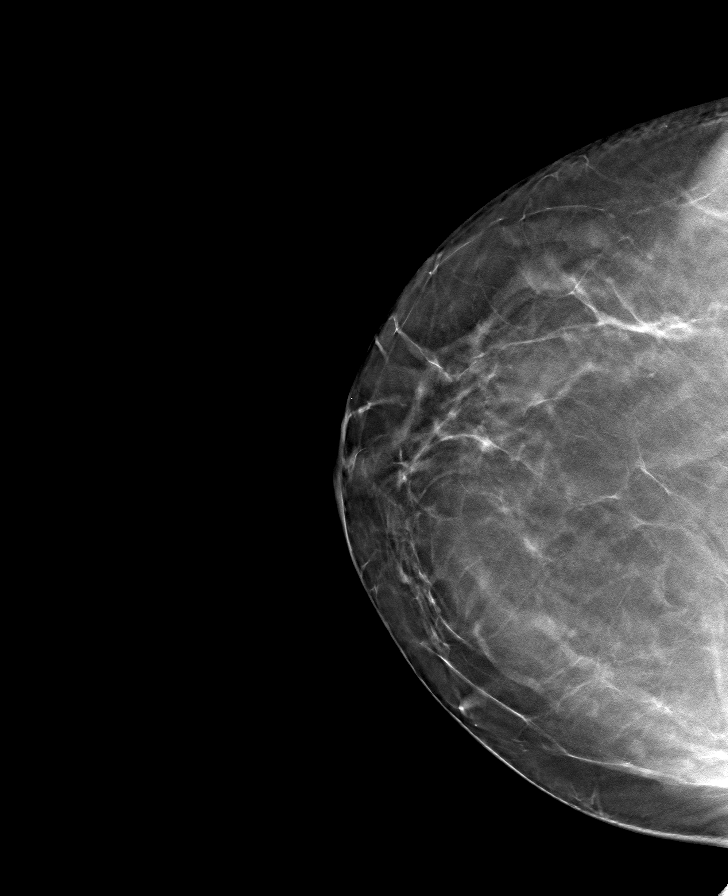

[L CC tomo · tomo slice 49/97.0]
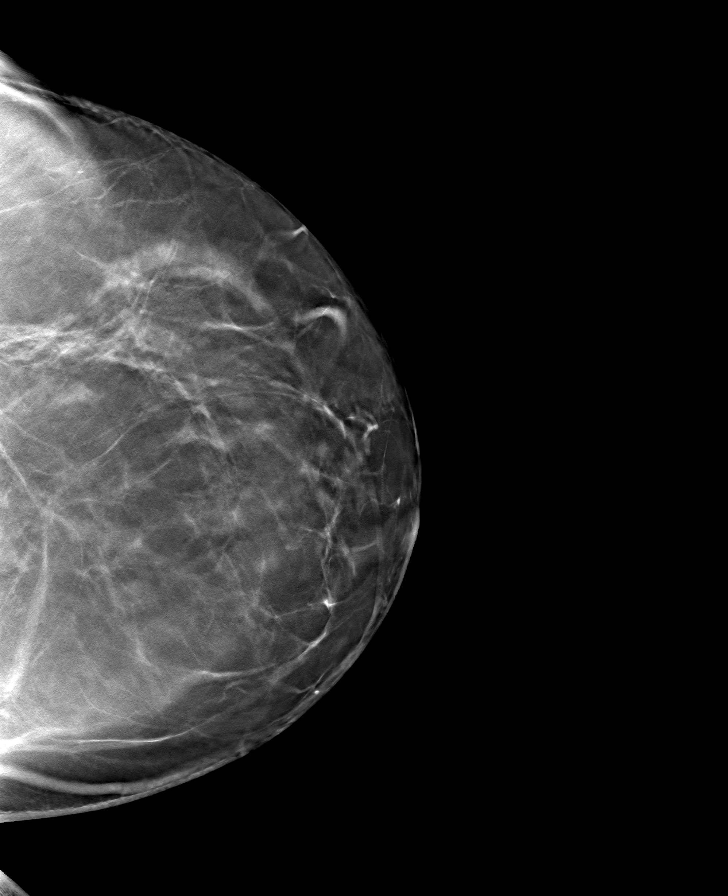

[R MLO tomo · tomo slice 46/91.0]
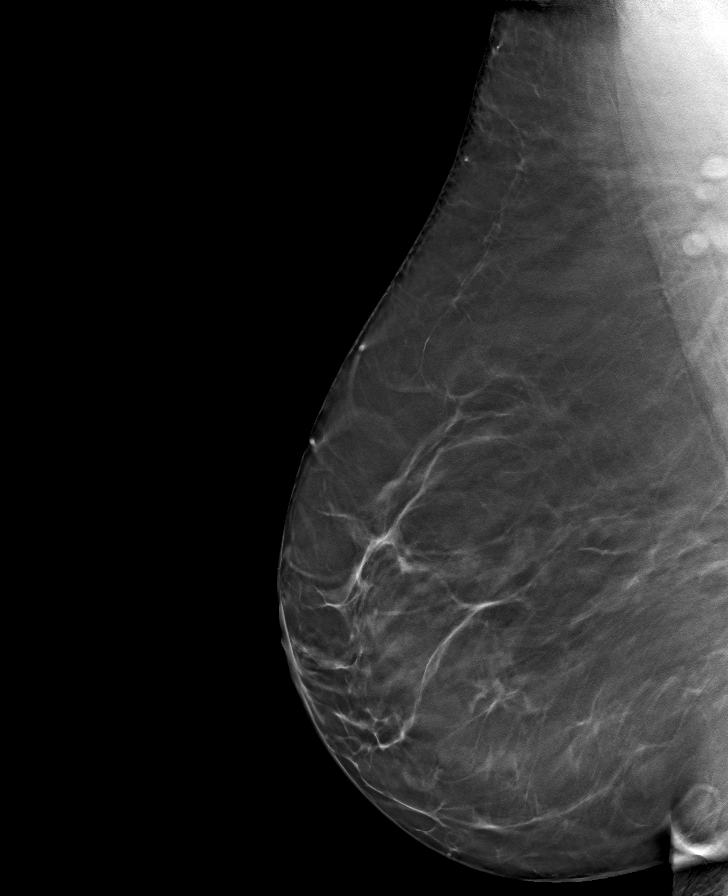

[L MLO tomo · tomo slice 49/96.0]
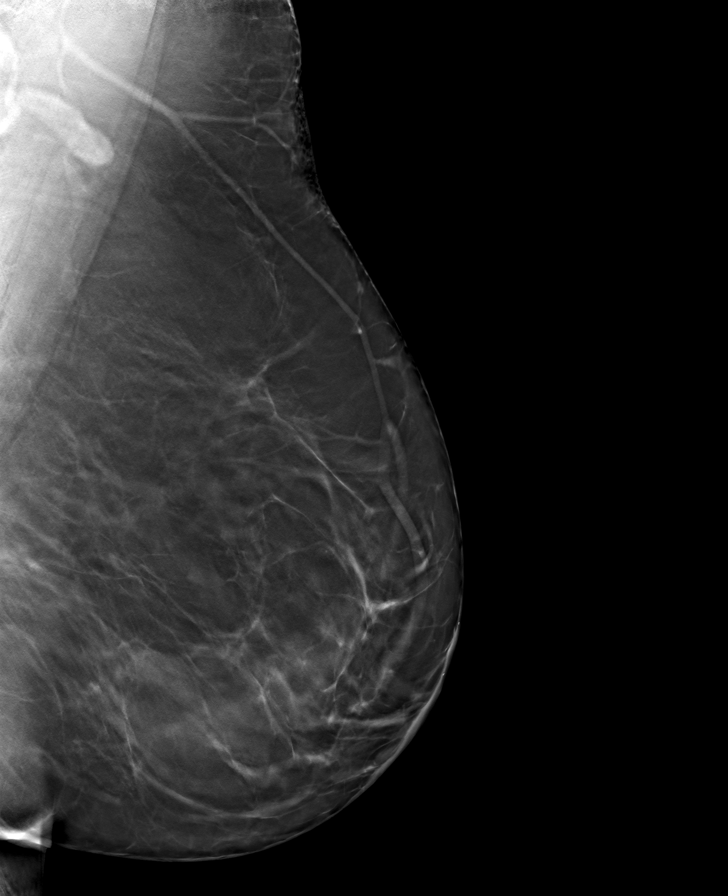

[8 of 24 positions shown; findings below may reference images not displayed]

ACR Breast Density Category b: There are scattered areas of
fibroglandular density.
FINDINGS: There are no findings suspicious for malignancy. Images were
processed with CAD.
IMPRESSION: No mammographic evidence of malignancy. A result letter of this
screening mammogram will be mailed directly to the patient.

RECOMMENDATION:
Screening mammogram in one year. (Code:[id number])

BI-RADS CATEGORY  1: Negative.

## 2021-09-28 ENCOUNTER — Other Ambulatory Visit: Payer: Self-pay | Admitting: Obstetrics & Gynecology

## 2021-10-09 ENCOUNTER — Other Ambulatory Visit (HOSPITAL_BASED_OUTPATIENT_CLINIC_OR_DEPARTMENT_OTHER): Payer: Self-pay | Admitting: Obstetrics & Gynecology

## 2021-10-09 DIAGNOSIS — Z79890 Hormone replacement therapy (postmenopausal): Secondary | ICD-10-CM

## 2021-10-09 DIAGNOSIS — Z01419 Encounter for gynecological examination (general) (routine) without abnormal findings: Secondary | ICD-10-CM

## 2021-10-09 NOTE — Telephone Encounter (Signed)
LMOVM for pt to call regarding refill request 

## 2021-11-27 ENCOUNTER — Ambulatory Visit
Admission: RE | Admit: 2021-11-27 | Discharge: 2021-11-27 | Disposition: A | Discharge status: Home / Self Care | Payer: BC Managed Care – PPO | Source: Ambulatory Visit | Attending: Obstetrics & Gynecology | Admitting: Obstetrics & Gynecology

## 2021-11-27 ENCOUNTER — Ambulatory Visit: Payer: BC Managed Care – PPO

## 2021-11-27 DIAGNOSIS — Z1231 Encounter for screening mammogram for malignant neoplasm of breast: Secondary | ICD-10-CM

## 2021-12-17 ENCOUNTER — Other Ambulatory Visit: Payer: BC Managed Care – PPO

## 2022-01-21 ENCOUNTER — Other Ambulatory Visit: Payer: BC Managed Care – PPO

## 2022-01-24 ENCOUNTER — Other Ambulatory Visit: Payer: Self-pay | Admitting: Internal Medicine

## 2022-01-24 DIAGNOSIS — E039 Hypothyroidism, unspecified: Secondary | ICD-10-CM

## 2022-03-14 ENCOUNTER — Other Ambulatory Visit (HOSPITAL_BASED_OUTPATIENT_CLINIC_OR_DEPARTMENT_OTHER): Payer: Self-pay | Admitting: Obstetrics & Gynecology

## 2022-03-14 DIAGNOSIS — Z01419 Encounter for gynecological examination (general) (routine) without abnormal findings: Secondary | ICD-10-CM

## 2022-03-14 DIAGNOSIS — Z79890 Hormone replacement therapy (postmenopausal): Secondary | ICD-10-CM

## 2022-05-06 ENCOUNTER — Ambulatory Visit: Payer: BC Managed Care – PPO | Admitting: Internal Medicine

## 2022-05-08 ENCOUNTER — Encounter: Payer: Self-pay | Admitting: Internal Medicine

## 2022-05-08 ENCOUNTER — Ambulatory Visit: Payer: BC Managed Care – PPO | Admitting: Internal Medicine

## 2022-05-08 VITALS — BP 130/100 | HR 97 | Ht 63.0 in | Wt 194.2 lb

## 2022-05-08 DIAGNOSIS — E038 Other specified hypothyroidism: Secondary | ICD-10-CM | POA: Diagnosis not present

## 2022-05-08 DIAGNOSIS — E063 Autoimmune thyroiditis: Secondary | ICD-10-CM | POA: Diagnosis not present

## 2022-05-08 DIAGNOSIS — E039 Hypothyroidism, unspecified: Secondary | ICD-10-CM

## 2022-05-08 LAB — TSH: TSH: 1.21 u[IU]/mL (ref 0.35–5.50)

## 2022-05-08 LAB — T4, FREE: Free T4: 0.92 ng/dL (ref 0.60–1.60)

## 2022-05-08 MED ORDER — LEVOTHYROXINE SODIUM 50 MCG PO TABS: 50.0000 ug | ORAL_TABLET | Freq: Every day | ORAL | 3 refills | Status: AC

## 2022-05-08 NOTE — Patient Instructions (Signed)
Please continue Levothyroxine 50 mcg daily.  Take the thyroid hormone every day, with water, at least 30 minutes before breakfast, separated by at least 4 hours from: - acid reflux medications - calcium - iron - multivitamins  Please stop at the lab.  Please come back for a follow-up appointment in 1 year. 

## 2022-05-08 NOTE — Progress Notes (Signed)
Patient ID: Katherine Young, female   DOB: 09/06/1956, 66 y.o.   MRN: [id number]   HPI  Katherine Young is a 66 y.o.-year-old female, initially referred by her OB/GYN doctor, Dr. Hyacinth MeekerMiller, returning for follow-up for hypothyroidism.  Last visit 1 year ago.  Interim history: Lost 16 lbs since last OV: keto-like diet and Orange theory: cardio, strength.  She lives in [place] but drives to [place] for this. She is working part time at a college. She has some hair loss, but improved.  Reviewed and addended history: Pt. was found to have a high TSH on 09/05/2019.  At that time, she saw Dr. Loreta AveMann she was complaining of constipation, fatigue, hair loss.   The tests were repeated by PCP 9 days later and the TSH was still high.  Free T4 was normal.    In 10/2019, we checked a TSH and is still slightly elevated while her free thyroid hormones and thyroid antibodies were normal.  We started levothyroxine 25 mcg daily.  In 02/2020, I advised her to increase the dose to 50 mcg daily.  She did not do so at that time.  In 11/2020, I advised her to we increase the dose to 50 mcg daily.  She again did not increase the dose as she forgot.  In 04/2021, I again advised her to increase the dose to 50 mcg daily.  She did this, but did not return for labs as advised...  Pt is still on levothyroxine 50 mcg daily (increased 04/2021), taken: - in am - fasting - at least 30 min from b'fast - no calcium - no iron - Prev. multivitamins 1h later >> now off - no PPIs - stopped B complex  Reviewed her TFTs: Lab Results  Component Value Date   TSH 5.69 (H) 04/30/2021   TSH 4.31 12/03/2020   TSH 5.28 (H) 03/12/2020   TSH 5.81 (H) 11/09/2019   TSH 7.600 (H) 09/14/2019   FREET4 0.75 04/30/2021   FREET4 0.67 12/03/2020   FREET4 0.75 03/12/2020   FREET4 0.68 11/09/2019   FREET4 0.84 09/14/2019   T3FREE 2.8 11/09/2019  09/05/2019: TSH 8.14  She did not have elevated antithyroid antibodies Component     Latest  Ref Rng & Units 11/09/2019  Thyroperoxidase Ab SerPl-aCnc     <9 IU/mL 2  Thyroglobulin Ab     < or = 1 IU/mL <1   At the end of 2020, she described difficulty with losing weight despite being active and previously running, before her COVID-19 diagnosis.  She also had fatigue, heat intolerance, constipation and hair loss.  After starting levothyroxine, symptoms improved just a little, however, before our visit from 02/2020, she lost approximately 30 pounds.  Weight was stable at last visit.  Hair loss improved.  At this visit, hair loss improved even more and weight also improved.  Pt denies: - feeling nodules in neck - hoarseness - dysphagia - choking  No FH of thyroid cancer. No h/o radiation tx to head or neck. No herbal supplements. No Biotin use. No recent steroids use.   She was on Qsymia. She has a h/o gastric lap band Sx in 2012.  However, this did not work for her. She also has controlled HTN. She also has scalp eczema.  ROS: + see HPI + C (on Amitiza) + joint aches - knees - better with walking  I reviewed pt's medications, allergies, PMH, social hx, family hx, and changes were documented in the history of present illness.  Otherwise, unchanged from my initial visit note.  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   Hypertension    Past Surgical History:  Procedure Laterality Date   ABDOMINOPLASTY  9/13   ABLATION  10/2003   HTA   BREAST CYST ASPIRATION Right 04/2004   @ [facility]   BREAST ENHANCEMENT SURGERY  1983   BREAST IMPLANT REMOVAL  09/2006   LAPAROSCOPIC GASTRIC BANDING  11/12   REDUCTION MAMMAPLASTY Bilateral    implants removed   TYMPANOPLASTY  12/89; 11/90   Social History   Socioeconomic History   Marital status: Married    Spouse name: Not on file   Number of children: 2   Years of education: Not on file   Highest education level: Not on file  Occupational  History    Administrative services at [employer]  Tobacco Use   Smoking status: Never Smoker   Smokeless tobacco: Never Used  Substance and Sexual Activity   Alcohol use: Yes    Alcohol/week: 1.0 - 2.0 standard drinks    Types: 1 - 2 Standard (mixed) drinks occasionally   Drug use: No   Sexual activity: Yes    Partners: Male    Birth control/protection: Surgical    Comment: Ablation  Other Topics Concern   Not on file  Social History Narrative   Not on file   Social Determinants of Health   Financial Resource Strain:    Difficulty of Paying Living Expenses: Not on file  Food Insecurity:    Worried About Programme researcher, broadcasting/film/video in the Last Year: Not on file   [employer] of Food in the Last Year: Not on file  Transportation Needs:    Lack of Transportation (Medical): Not on file   Lack of Transportation (Non-Medical): Not on file  Physical Activity:    Days of Exercise per Week: Not on file   Minutes of Exercise per Session: Not on file  Stress:    Feeling of Stress : Not on file  Social Connections:    Frequency of Communication with Friends and Family: Not on file   Frequency of Social Gatherings with Friends and Family: Not on file   Attends Religious Services: Not on file   Active Member of Clubs or Organizations: Not on file   Attends Banker Meetings: Not on file   Marital Status: Not on file  Intimate Partner Violence:    Fear of Current or Ex-Partner: Not on file   Emotionally Abused: Not on file   Physically Abused: Not on file   Sexually Abused: Not on file   Current Outpatient Medications on File Prior to Visit  Medication Sig Dispense Refill   citalopram (CELEXA) 20 MG tablet Take 20 mg by mouth daily.     Estradiol-Norethindrone Acet 0.5-0.1 MG tablet TAKE 1 TABLET DAILY 84 tablet 0   fluconazole (DIFLUCAN) 150 MG tablet take 1 tab and repeat in 72 hours 1 tablet 0   levothyroxine (SYNTHROID) 50 MCG tablet TAKE 1 TABLET BY MOUTH EVERY DAY BEFORE  BREAKFAST 90 tablet 3   levothyroxine (SYNTHROID) 50 MCG tablet TAKE 1 TABLET BY MOUTH DAILY BEFORE BREAKFAST 90 tablet 0   lubiprostone (AMITIZA) 24 MCG capsule Take 24 mcg by mouth daily with breakfast.      triamcinolone ointment (KENALOG) 0.5 % APPLY THIN LAYER TO SKIN ONCE A DAY. WHEN ITCHING STOPS,  STOP USING THE STEROID 30 g 1   valACYclovir (VALTREX) 500 MG tablet WITH ONSET OF SYMPTOMS, TAKE ONE TABLET TWICE DAILY X 3 DAYS 30 tablet 3   valsartan-hydrochlorothiazide (DIOVAN-HCT) 160-12.5 MG tablet Take 1 tablet by mouth daily.     No current facility-administered medications on file prior to visit.   No Known Allergies Family History  Problem Relation Age of Onset   Breast cancer Mother 44       dec 73-had mets to liver from breast ca   Hypertension Mother    Heart disease Mother    Hypertension Father    Heart disease Father    Heart attack Father    PE: BP (!) 130/100 (BP Location: Left Arm, Patient Position: Sitting, Cuff Size: Normal)   Pulse 97   Ht  (1.6 m)   Wt 194 lb 3.2 oz (88.1 kg)   LMP 10/01/2003   SpO2 97%   BMI 34.40 kg/m  Wt Readings from Last 3 Encounters:  05/08/22 194 lb 3.2 oz (88.1 kg)  06/23/21 210 lb 9.6 oz (95.5 kg)  04/30/21 209 lb 12.8 oz (95.2 kg)   Constitutional: overweight, in NAD Eyes: EOMI, no exophthalmos ENT: moist mucous membranes, no masses palpated in neck, no cervical lymphadenopathy Cardiovascular: tachycardia, RR, No MRG Respiratory: CTA B Musculoskeletal: no deformities Skin: moist, warm, no rashes Neurological: no tremor with outstretched hands  ASSESSMENT: 1.  Hashimoto's hypothyroidism  PLAN:  1. Patient with mild, acquired, hypothyroidism, with normal thyroid antibody levels.  She was diagnosed in 2020 when she presented with constipation, fatigue, hair loss, inability to lose weight.  The TSH was slightly above the upper limit of normal.  We started a low-dose levothyroxine, 25 mcg daily.  We increased the dose  afterwards.  She is currently on 50 mcg daily, dose increased 04/2021.  She did not come back for labs 1.5 months after this dose change. -At this visit, we discussed about the importance of coming back for labs 5 to 6 weeks after any change in dose - latest thyroid labs reviewed with pt. >> elevated, before increasing her levothyroxine dose: Lab Results  Component Value Date   TSH 5.69 (H) 04/30/2021  - she continues on LT4 50 mcg daily - pt feels good on this dose.  She lost 16 pounds since last visit, but this was intentional.  At today's visit she had a slightly high blood pressure and also pulse but she comes from [facility]. - we discussed about taking the thyroid hormone every day, with water, >30 minutes before breakfast, separated by >4 hours from acid reflux medications, calcium, iron, multivitamins. Pt. is taking it correctly. - will check thyroid tests today: TSH and fT4 - If labs are abnormal, she will need to return for repeat TFTs in 1.5 months - I we will see her back in a year, but possibly sooner for labs.  Office Visit on 05/08/2022  Component Date Value Ref Range Status   TSH 05/08/2022 1.21  0.35 - 5.50 uIU/mL Final   Free T4 05/08/2022 0.92  0.60 - 1.60 ng/dL Final   Comment: Specimens from patients who are undergoing biotin therapy and /or ingesting biotin supplements may contain high levels of biotin.  The higher biotin concentration in these specimens interferes with this Free T4 assay.  Specimens that contain high levels  of biotin may cause false high results for this Free T4 assay.  Please interpret results in light of the total clinical presentation of  the patient.     TFTs are normal.  Carlus Pavlov, MD PhD [facility] Endocrin[facility]

## 2022-06-09 ENCOUNTER — Other Ambulatory Visit: Payer: Self-pay | Admitting: Obstetrics & Gynecology

## 2022-06-09 DIAGNOSIS — E2839 Other primary ovarian failure: Secondary | ICD-10-CM

## 2022-06-16 ENCOUNTER — Other Ambulatory Visit: Payer: BC Managed Care – PPO

## 2022-06-18 ENCOUNTER — Other Ambulatory Visit (HOSPITAL_BASED_OUTPATIENT_CLINIC_OR_DEPARTMENT_OTHER): Payer: Self-pay | Admitting: Obstetrics & Gynecology

## 2022-06-18 DIAGNOSIS — Z79890 Hormone replacement therapy (postmenopausal): Secondary | ICD-10-CM

## 2022-06-18 DIAGNOSIS — Z01419 Encounter for gynecological examination (general) (routine) without abnormal findings: Secondary | ICD-10-CM

## 2022-07-11 ENCOUNTER — Other Ambulatory Visit: Payer: Self-pay | Admitting: Obstetrics & Gynecology

## 2022-09-09 ENCOUNTER — Ambulatory Visit (INDEPENDENT_AMBULATORY_CARE_PROVIDER_SITE_OTHER): Payer: BC Managed Care – PPO | Admitting: Obstetrics & Gynecology

## 2022-09-09 ENCOUNTER — Encounter (HOSPITAL_BASED_OUTPATIENT_CLINIC_OR_DEPARTMENT_OTHER): Payer: Self-pay | Admitting: Obstetrics & Gynecology

## 2022-09-09 VITALS — BP 110/72 | HR 82 | Ht 64.0 in | Wt 189.0 lb

## 2022-09-09 DIAGNOSIS — Z23 Encounter for immunization: Secondary | ICD-10-CM | POA: Diagnosis not present

## 2022-09-09 DIAGNOSIS — A609 Anogenital herpesviral infection, unspecified: Secondary | ICD-10-CM

## 2022-09-09 DIAGNOSIS — E039 Hypothyroidism, unspecified: Secondary | ICD-10-CM

## 2022-09-09 DIAGNOSIS — Z79890 Hormone replacement therapy (postmenopausal): Secondary | ICD-10-CM | POA: Diagnosis not present

## 2022-09-09 DIAGNOSIS — Z01419 Encounter for gynecological examination (general) (routine) without abnormal findings: Secondary | ICD-10-CM | POA: Diagnosis not present

## 2022-09-09 DIAGNOSIS — L309 Dermatitis, unspecified: Secondary | ICD-10-CM

## 2022-09-09 DIAGNOSIS — Z803 Family history of malignant neoplasm of breast: Secondary | ICD-10-CM

## 2022-09-09 MED ORDER — VALACYCLOVIR HCL 500 MG PO TABS: ORAL_TABLET | ORAL | 3 refills | Status: DC

## 2022-09-09 MED ORDER — TRIAMCINOLONE ACETONIDE 0.5 % EX OINT: TOPICAL_OINTMENT | CUTANEOUS | 1 refills | Status: DC

## 2022-09-09 MED ORDER — ESTRADIOL-NORETHINDRONE ACET 0.5-0.1 MG PO TABS: 1.0000 | ORAL_TABLET | Freq: Every day | ORAL | 3 refills | Status: DC

## 2022-09-09 NOTE — Progress Notes (Signed)
66 y.o. G45P2002 Married White or Caucasian female here for annual exam.  Doing well on HRT.  Doesn't want to change dosage.  Denies vaginal bleeding.  Has been having some issues with UTIs.  Has seen Dr Willey Blade.  Urology referral discussed.    Patient's last menstrual period was 10/01/2003.          Sexually active: No.   Due to husband's medical hx The current method of family planning is post menopausal status.    Smoker:  no  Health Maintenance: Pap:  06/23/2021 Negative History of abnormal Pap:  no MMG:  11/27/2021 Negative Colonoscopy:  10/2019.  Follow up 10 years.   BMD:   scheduled Screening Labs: Dr. Cruzita Lederer, Dr. Willey Blade   reports that she has never smoked. She has never used smokeless tobacco. She reports current alcohol use of about 1.0 - 2.0 standard drink of alcohol per week. She reports that she does not use drugs.  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     fluconazole (DIFLUCAN) 150 MG tablet take 1 tab and repeat in 72 hours 1 tablet 0   levothyroxine (SYNTHROID) 50 MCG tablet Take 1 tablet (50 mcg total) by mouth daily before breakfast. 90 tablet 3   lubiprostone (AMITIZA) 24 MCG capsule Take 24 mcg by mouth daily with breakfast.      valsartan-hydrochlorothiazide (DIOVAN-HCT) 160-12.5 MG tablet Take 1 tablet by mouth daily.     Estradiol-Norethindrone Acet 0.5-0.1 MG tablet Take 1 tablet by mouth  daily. 84 tablet 3   triamcinolone ointment (KENALOG) 0.5 % Apply topically twice daily as needed for itching.  Do not use for more than 5 days at a time. 30 g 1   valACYclovir (VALTREX) 500 MG tablet WITH ONSET OF SYMPTOMS, TAKE ONE TABLET TWICE DAILY X 3 DAYS 30 tablet 3   No current facility-administered medications for this visit.    Family History  Problem Relation Age of Onset   Breast cancer Mother 52       dec 73-had mets to liver from breast ca   Hypertension Mother    Heart disease Mother    Hypertension Father    Heart disease Father    Heart attack Father     ROS: Constitutional: negative Genitourinary:negative  Exam:   BP 110/72 (BP Location: Left Arm, Patient Position: Sitting, Cuff Size: Large)   Pulse 82   Ht 5\' 4"  (1.626 m) Comment: Reported  Wt 189 lb (85.7 kg)   LMP 10/01/2003   BMI 32.44 kg/m   Height: 5\' 4"  (162.6 cm) (Reported)  General appearance: alert, cooperative and appears stated age Head: Normocephalic, without obvious abnormality, atraumatic Neck: no adenopathy, supple, symmetrical, trachea midline and thyroid normal to inspection and palpation Lungs: clear to auscultation bilaterally Breasts: normal appearance, no masses or tenderness  Heart: regular rate and rhythm Abdomen: soft, non-tender; bowel sounds normal; no masses,  no organomegaly Extremities: extremities normal, atraumatic, no cyanosis or edema Skin: Skin color, texture, turgor normal. No rashes or lesions Lymph nodes: Cervical, supraclavicular, and axillary nodes normal. No abnormal inguinal nodes palpated Neurologic: Grossly normal   Pelvic: External genitalia:  no lesions              Urethra:  normal appearing urethra with no masses, tenderness or lesions              Bartholins and Skenes: normal                 Vagina: normal appearing vagina with normal color and no discharge, no lesions              Cervix: no lesions              Pap taken: No. Bimanual Exam:   Uterus:  normal size, contour, position, consistency, mobility, non-tender              Adnexa: normal adnexa and no mass, fullness, tenderness               Rectovaginal: Confirms               Anus:  normal sphincter tone, no lesions  Chaperone, Raechel Ache, RN, was present for exam.  Assessment/Plan: 1. Well woman exam with routine gynecological exam - Pap smear with HR HPV 2022 - Mammogram 10/2021 - Colonoscopy 10/2019 with neg HR HPV - Bone mineral density scheduled for December - lab work done done with Dr. Ouida Sills and Dr. Lafe Garin - vaccines reviewed/updated - Flu Vaccine QUAD 36+ mos IM (Fluarix, Quad PF)  2. Vulvar dermatitis - triamcinolone ointment (KENALOG) 0.5 %; Apply topically twice daily as needed for itching.  Do not use for more than 5 days at a time.  Dispense: 30 g; Refill: 1  3. HSV (herpes simplex virus) anogenital infection - valACYclovir (VALTREX) 500 MG tablet; WITH ONSET OF SYMPTOMS, TAKE ONE TABLET TWICE DAILY X 3 DAYS  Dispense: 30 tablet; Refill: 3  4. Hormone replacement therapy - Estradiol-Norethindrone Acet 0.5-0.1 MG tablet; Take 1 tablet by mouth daily.  Dispense: 84 tablet; Refill: 3  5. Family history of breast cancer - limited breast MRI has been discussed  6. Hypothyroidism (acquired) - followed by endocrinology

## 2022-09-09 NOTE — Patient Instructions (Addendum)
Femdophilus Estradiol cream, topically two or three times weekly

## 2022-09-10 ENCOUNTER — Other Ambulatory Visit (HOSPITAL_BASED_OUTPATIENT_CLINIC_OR_DEPARTMENT_OTHER): Payer: Self-pay | Admitting: Obstetrics & Gynecology

## 2022-09-10 DIAGNOSIS — Z79890 Hormone replacement therapy (postmenopausal): Secondary | ICD-10-CM

## 2022-10-30 ENCOUNTER — Encounter: Payer: Self-pay | Admitting: Obstetrics & Gynecology

## 2022-10-30 DIAGNOSIS — Z1231 Encounter for screening mammogram for malignant neoplasm of breast: Secondary | ICD-10-CM

## 2022-11-02 ENCOUNTER — Other Ambulatory Visit: Payer: Self-pay | Admitting: Obstetrics & Gynecology

## 2022-11-02 DIAGNOSIS — Z1231 Encounter for screening mammogram for malignant neoplasm of breast: Secondary | ICD-10-CM

## 2022-11-17 ENCOUNTER — Ambulatory Visit
Admission: RE | Admit: 2022-11-17 | Discharge: 2022-11-17 | Disposition: A | Discharge status: Home / Self Care | Payer: BC Managed Care – PPO | Source: Ambulatory Visit | Attending: Obstetrics & Gynecology | Admitting: Obstetrics & Gynecology

## 2022-11-17 DIAGNOSIS — E2839 Other primary ovarian failure: Secondary | ICD-10-CM

## 2022-12-25 ENCOUNTER — Ambulatory Visit
Admission: RE | Admit: 2022-12-25 | Discharge: 2022-12-25 | Disposition: A | Discharge status: Home / Self Care | Payer: BC Managed Care – PPO | Source: Ambulatory Visit | Attending: Obstetrics & Gynecology | Admitting: Obstetrics & Gynecology

## 2022-12-25 DIAGNOSIS — Z1231 Encounter for screening mammogram for malignant neoplasm of breast: Secondary | ICD-10-CM

## 2023-08-06 DIAGNOSIS — M180 Bilateral primary osteoarthritis of first carpometacarpal joints: Secondary | ICD-10-CM | POA: Diagnosis not present

## 2023-08-16 DIAGNOSIS — H04123 Dry eye syndrome of bilateral lacrimal glands: Secondary | ICD-10-CM | POA: Diagnosis not present

## 2023-08-20 ENCOUNTER — Other Ambulatory Visit (HOSPITAL_BASED_OUTPATIENT_CLINIC_OR_DEPARTMENT_OTHER): Payer: Self-pay | Admitting: Obstetrics & Gynecology

## 2023-08-20 DIAGNOSIS — Z79890 Hormone replacement therapy (postmenopausal): Secondary | ICD-10-CM

## 2023-08-20 NOTE — Telephone Encounter (Signed)
Patient will receive additional refills at next office visit

## 2023-09-22 DIAGNOSIS — F419 Anxiety disorder, unspecified: Secondary | ICD-10-CM | POA: Diagnosis not present

## 2023-09-22 DIAGNOSIS — E039 Hypothyroidism, unspecified: Secondary | ICD-10-CM | POA: Diagnosis not present

## 2023-09-22 DIAGNOSIS — F900 Attention-deficit hyperactivity disorder, predominantly inattentive type: Secondary | ICD-10-CM | POA: Diagnosis not present

## 2023-09-22 DIAGNOSIS — Z79899 Other long term (current) drug therapy: Secondary | ICD-10-CM | POA: Diagnosis not present

## 2023-09-22 DIAGNOSIS — I10 Essential (primary) hypertension: Secondary | ICD-10-CM | POA: Diagnosis not present

## 2023-09-22 DIAGNOSIS — E785 Hyperlipidemia, unspecified: Secondary | ICD-10-CM | POA: Diagnosis not present

## 2023-09-24 ENCOUNTER — Encounter (HOSPITAL_BASED_OUTPATIENT_CLINIC_OR_DEPARTMENT_OTHER): Payer: Self-pay | Admitting: Obstetrics & Gynecology

## 2023-09-24 ENCOUNTER — Ambulatory Visit (HOSPITAL_BASED_OUTPATIENT_CLINIC_OR_DEPARTMENT_OTHER): Payer: Medicare PPO | Admitting: Obstetrics & Gynecology

## 2023-09-24 VITALS — BP 99/71 | HR 68 | Ht 64.0 in | Wt 189.0 lb

## 2023-09-24 DIAGNOSIS — R350 Frequency of micturition: Secondary | ICD-10-CM | POA: Diagnosis not present

## 2023-09-24 DIAGNOSIS — Z79890 Hormone replacement therapy (postmenopausal): Secondary | ICD-10-CM | POA: Diagnosis not present

## 2023-09-24 DIAGNOSIS — L309 Dermatitis, unspecified: Secondary | ICD-10-CM

## 2023-09-24 DIAGNOSIS — Z01419 Encounter for gynecological examination (general) (routine) without abnormal findings: Secondary | ICD-10-CM | POA: Diagnosis not present

## 2023-09-24 DIAGNOSIS — A609 Anogenital herpesviral infection, unspecified: Secondary | ICD-10-CM | POA: Diagnosis not present

## 2023-09-24 LAB — POCT URINALYSIS DIPSTICK
Bilirubin, UA: NEGATIVE
Blood, UA: NEGATIVE
Glucose, UA: NEGATIVE
Ketones, UA: NEGATIVE
Leukocytes, UA: NEGATIVE
Nitrite, UA: NEGATIVE
Protein, UA: NEGATIVE
Spec Grav, UA: 1.025 (ref 1.010–1.025)
Urobilinogen, UA: 0.2 U/dL
pH, UA: 6.5 (ref 5.0–8.0)

## 2023-09-24 MED ORDER — ESTRADIOL-NORETHINDRONE ACET 0.5-0.1 MG PO TABS: 1.0000 | ORAL_TABLET | Freq: Every day | ORAL | 3 refills | Status: DC

## 2023-09-24 MED ORDER — TRIAMCINOLONE ACETONIDE 0.5 % EX OINT: TOPICAL_OINTMENT | CUTANEOUS | 1 refills | Status: DC

## 2023-09-24 MED ORDER — VALACYCLOVIR HCL 500 MG PO TABS: ORAL_TABLET | ORAL | 3 refills | Status: AC

## 2023-09-24 NOTE — Progress Notes (Signed)
67 y.o. G50P2002 Married White or Caucasian female here for breast and pelvic exam.  I am also following her for HRT use.  Denies vaginal bleeding.  Still having intermittent vulvar itching.  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 Needs kenalog steroid refill.    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    Patient's last menstrual period was 10/01/2003.          Sexually active: Yes.    H/O STD:  no  Health Maintenance: PCP:  Dr. Ouida Sills.  Wellness is scheduled for next week.  Just had blood work done.   Vaccines are up to date:  reviewed with pt today Colonoscopy:  10/2019, follow up 10 years MMG:  11/2022 BMD:  11/17/2022, normal Last pap smear:  05/2021.   H/o abnormal pap smear:  no    reports that she has never smoked. She has never used smokeless tobacco. She reports current alcohol use of about 1.0 - 2.0 standard drink of alcohol per week. She reports that she does not use drugs.  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     levothyroxine (SYNTHROID) 50 MCG tablet Take 1 tablet (50 mcg total) by mouth daily before breakfast. 90 tablet 3   lubiprostone (AMITIZA) 24 MCG capsule Take 24 mcg by mouth daily with breakfast.      valsartan-hydrochlorothiazide (DIOVAN-HCT) 160-12.5 MG tablet Take 1 tablet by mouth daily.     Estradiol-Norethindrone Acet 0.5-0.1 MG tablet Take  1 tablet by mouth daily. 84 tablet 3   triamcinolone ointment (KENALOG) 0.5 % Apply topically twice daily as needed for itching.  Do not use for more than 5 days at a time. 60 g 1   valACYclovir (VALTREX) 500 MG tablet WITH ONSET OF SYMPTOMS, TAKE ONE TABLET TWICE DAILY X 3 DAYS 30 tablet 3   No current facility-administered medications for this visit.    Family History  Problem Relation Age of Onset   Breast cancer Mother 24       dec 73-had mets to liver from breast ca   Hypertension Mother    Heart disease Mother    Hypertension Father    Heart disease Father    Heart attack Father     Review of Systems  Constitutional: Negative.   Genitourinary:        Sometimes feels like she doesn't empty completely    Exam:   BP 99/71 (BP Location: Right Arm, Patient Position: Sitting, Cuff Size: Normal)   Pulse 68   Ht 5\' 4"  (1.626 m)   Wt 189 lb (85.7 kg)   LMP 10/01/2003   BMI 32.44 kg/m   Height: 5\' 4"  (162.6 cm)  General appearance: alert, cooperative and appears stated age Breasts: normal appearance, no masses or tenderness Abdomen:  soft, non-tender; bowel sounds normal; no masses,  no organomegaly Lymph nodes: Cervical, supraclavicular, and axillary nodes normal.  No abnormal inguinal nodes palpated Neurologic: Grossly normal  Pelvic: External genitalia:  small area of hypopigmentation at inferior edge of right labia majora              Urethra:  normal appearing urethra with no masses, tenderness or lesions              Bartholins and Skenes: normal                 Vagina: normal appearing vagina with atrophic changes and no discharge, no lesions              Cervix: no lesions              Pap taken: No. Bimanual Exam:  Uterus:  normal size, contour, position, consistency, mobility, non-tender              Adnexa: normal adnexa and no mass, fullness, tenderness               Rectovaginal: Confirms               Anus:  normal sphincter tone, no lesions  Chaperone,  Hendricks Milo, CMA, was present for exam.  Assessment/Plan: 1. Encntr for gyn exam (general) (routine) w/o abn findings - Pap smear 06/23/2021.  Not indicated today. - Mammogram 11/17/2022 - Colonoscopy 2020, follow up 10 years - Bone mineral density 2023, normal - lab work done with PCP - vaccines reviewed/updated  2. Hormone replacement therapy - desires to continue - Estradiol-Norethindrone Acet 0.5-0.1 MG tablet; Take 1 tablet by mouth daily.  Dispense: 84 tablet; Refill: 3  3. HSV (herpes simplex virus) anogenital infection - valACYclovir (VALTREX) 500 MG tablet; WITH ONSET OF SYMPTOMS, TAKE ONE TABLET TWICE DAILY X 3 DAYS  Dispense: 30 tablet; Refill: 3  4. Vulvar dermatitis - triamcinolone ointment (KENALOG) 0.5 %; Apply topically twice daily as needed for itching.  Do not use for more than 5 days at a time.  Dispense: 60 g; Refill: 1

## 2023-09-28 DIAGNOSIS — R5382 Chronic fatigue, unspecified: Secondary | ICD-10-CM | POA: Diagnosis not present

## 2023-09-28 DIAGNOSIS — L658 Other specified nonscarring hair loss: Secondary | ICD-10-CM | POA: Diagnosis not present

## 2023-09-28 DIAGNOSIS — E559 Vitamin D deficiency, unspecified: Secondary | ICD-10-CM | POA: Diagnosis not present

## 2023-09-28 DIAGNOSIS — R891 Abnormal level of hormones in specimens from other organs, systems and tissues: Secondary | ICD-10-CM | POA: Diagnosis not present

## 2023-09-29 DIAGNOSIS — I10 Essential (primary) hypertension: Secondary | ICD-10-CM | POA: Diagnosis not present

## 2023-09-29 DIAGNOSIS — E785 Hyperlipidemia, unspecified: Secondary | ICD-10-CM | POA: Diagnosis not present

## 2023-11-26 ENCOUNTER — Other Ambulatory Visit: Payer: Self-pay | Admitting: Obstetrics & Gynecology

## 2023-11-26 DIAGNOSIS — Z1231 Encounter for screening mammogram for malignant neoplasm of breast: Secondary | ICD-10-CM

## 2024-01-03 DIAGNOSIS — R5382 Chronic fatigue, unspecified: Secondary | ICD-10-CM | POA: Diagnosis not present

## 2024-01-03 DIAGNOSIS — L658 Other specified nonscarring hair loss: Secondary | ICD-10-CM | POA: Diagnosis not present

## 2024-01-03 DIAGNOSIS — R891 Abnormal level of hormones in specimens from other organs, systems and tissues: Secondary | ICD-10-CM | POA: Diagnosis not present

## 2024-01-03 DIAGNOSIS — E559 Vitamin D deficiency, unspecified: Secondary | ICD-10-CM | POA: Diagnosis not present

## 2024-01-05 ENCOUNTER — Ambulatory Visit: Payer: Medicare PPO

## 2024-01-10 ENCOUNTER — Ambulatory Visit
Admission: RE | Admit: 2024-01-10 | Discharge: 2024-01-10 | Disposition: A | Discharge status: Home / Self Care | Payer: Medicare PPO | Source: Ambulatory Visit | Attending: Obstetrics & Gynecology | Admitting: Obstetrics & Gynecology

## 2024-01-10 DIAGNOSIS — Z1231 Encounter for screening mammogram for malignant neoplasm of breast: Secondary | ICD-10-CM | POA: Diagnosis not present

## 2024-02-01 DIAGNOSIS — M545 Low back pain, unspecified: Secondary | ICD-10-CM | POA: Diagnosis not present

## 2024-02-29 DIAGNOSIS — L700 Acne vulgaris: Secondary | ICD-10-CM | POA: Diagnosis not present

## 2024-04-12 DIAGNOSIS — N958 Other specified menopausal and perimenopausal disorders: Secondary | ICD-10-CM | POA: Diagnosis not present

## 2024-04-12 DIAGNOSIS — L700 Acne vulgaris: Secondary | ICD-10-CM | POA: Diagnosis not present

## 2024-04-12 DIAGNOSIS — L658 Other specified nonscarring hair loss: Secondary | ICD-10-CM | POA: Diagnosis not present

## 2024-04-12 DIAGNOSIS — L820 Inflamed seborrheic keratosis: Secondary | ICD-10-CM | POA: Diagnosis not present

## 2024-04-12 DIAGNOSIS — R891 Abnormal level of hormones in specimens from other organs, systems and tissues: Secondary | ICD-10-CM | POA: Diagnosis not present

## 2024-04-12 DIAGNOSIS — R5382 Chronic fatigue, unspecified: Secondary | ICD-10-CM | POA: Diagnosis not present

## 2024-04-19 DIAGNOSIS — M180 Bilateral primary osteoarthritis of first carpometacarpal joints: Secondary | ICD-10-CM | POA: Diagnosis not present

## 2024-06-30 DIAGNOSIS — H43393 Other vitreous opacities, bilateral: Secondary | ICD-10-CM | POA: Diagnosis not present

## 2024-07-06 DIAGNOSIS — D692 Other nonthrombocytopenic purpura: Secondary | ICD-10-CM | POA: Diagnosis not present

## 2024-07-06 DIAGNOSIS — L738 Other specified follicular disorders: Secondary | ICD-10-CM | POA: Diagnosis not present

## 2024-07-06 DIAGNOSIS — D2222 Melanocytic nevi of left ear and external auricular canal: Secondary | ICD-10-CM | POA: Diagnosis not present

## 2024-07-06 DIAGNOSIS — L720 Epidermal cyst: Secondary | ICD-10-CM | POA: Diagnosis not present

## 2024-07-06 DIAGNOSIS — L814 Other melanin hyperpigmentation: Secondary | ICD-10-CM | POA: Diagnosis not present

## 2024-07-06 DIAGNOSIS — L821 Other seborrheic keratosis: Secondary | ICD-10-CM | POA: Diagnosis not present

## 2024-08-19 DIAGNOSIS — U071 COVID-19: Secondary | ICD-10-CM | POA: Diagnosis not present

## 2024-08-30 DIAGNOSIS — M180 Bilateral primary osteoarthritis of first carpometacarpal joints: Secondary | ICD-10-CM | POA: Diagnosis not present

## 2024-09-28 ENCOUNTER — Other Ambulatory Visit (HOSPITAL_BASED_OUTPATIENT_CLINIC_OR_DEPARTMENT_OTHER): Payer: Self-pay

## 2024-09-28 ENCOUNTER — Encounter (HOSPITAL_BASED_OUTPATIENT_CLINIC_OR_DEPARTMENT_OTHER): Payer: Self-pay | Admitting: Obstetrics & Gynecology

## 2024-09-28 DIAGNOSIS — L309 Dermatitis, unspecified: Secondary | ICD-10-CM

## 2024-09-28 MED ORDER — TRIAMCINOLONE ACETONIDE 0.5 % EX OINT: TOPICAL_OINTMENT | CUTANEOUS | 0 refills | Status: AC

## 2024-10-02 DIAGNOSIS — E039 Hypothyroidism, unspecified: Secondary | ICD-10-CM | POA: Diagnosis not present

## 2024-10-02 DIAGNOSIS — F419 Anxiety disorder, unspecified: Secondary | ICD-10-CM | POA: Diagnosis not present

## 2024-10-02 DIAGNOSIS — F900 Attention-deficit hyperactivity disorder, predominantly inattentive type: Secondary | ICD-10-CM | POA: Diagnosis not present

## 2024-10-02 DIAGNOSIS — E785 Hyperlipidemia, unspecified: Secondary | ICD-10-CM | POA: Diagnosis not present

## 2024-10-02 DIAGNOSIS — Z79899 Other long term (current) drug therapy: Secondary | ICD-10-CM | POA: Diagnosis not present

## 2024-10-02 DIAGNOSIS — I10 Essential (primary) hypertension: Secondary | ICD-10-CM | POA: Diagnosis not present

## 2024-10-12 DIAGNOSIS — E785 Hyperlipidemia, unspecified: Secondary | ICD-10-CM | POA: Diagnosis not present

## 2024-10-12 DIAGNOSIS — E030 Congenital hypothyroidism with diffuse goiter: Secondary | ICD-10-CM | POA: Diagnosis not present

## 2024-10-12 DIAGNOSIS — F901 Attention-deficit hyperactivity disorder, predominantly hyperactive type: Secondary | ICD-10-CM | POA: Diagnosis not present

## 2024-10-12 DIAGNOSIS — I10 Essential (primary) hypertension: Secondary | ICD-10-CM | POA: Diagnosis not present

## 2024-10-12 DIAGNOSIS — Z23 Encounter for immunization: Secondary | ICD-10-CM | POA: Diagnosis not present

## 2024-10-24 DIAGNOSIS — R891 Abnormal level of hormones in specimens from other organs, systems and tissues: Secondary | ICD-10-CM | POA: Diagnosis not present

## 2024-10-24 DIAGNOSIS — L658 Other specified nonscarring hair loss: Secondary | ICD-10-CM | POA: Diagnosis not present

## 2024-10-24 DIAGNOSIS — R5382 Chronic fatigue, unspecified: Secondary | ICD-10-CM | POA: Diagnosis not present

## 2024-10-27 ENCOUNTER — Other Ambulatory Visit (HOSPITAL_BASED_OUTPATIENT_CLINIC_OR_DEPARTMENT_OTHER): Payer: Self-pay | Admitting: Obstetrics & Gynecology

## 2024-10-27 DIAGNOSIS — Z79890 Hormone replacement therapy (postmenopausal): Secondary | ICD-10-CM

## 2024-12-20 ENCOUNTER — Other Ambulatory Visit: Payer: Self-pay | Admitting: Obstetrics & Gynecology

## 2024-12-20 DIAGNOSIS — Z1231 Encounter for screening mammogram for malignant neoplasm of breast: Secondary | ICD-10-CM

## 2025-01-11 ENCOUNTER — Ambulatory Visit

## 2025-02-01 ENCOUNTER — Ambulatory Visit (HOSPITAL_BASED_OUTPATIENT_CLINIC_OR_DEPARTMENT_OTHER): Admitting: Obstetrics & Gynecology
